# Patient Record
Sex: Female | Born: 1990 | Hispanic: Yes | Marital: Single | Smoking: Never smoker
Health system: Southern US, Community
[De-identification: ages and names within clinical notes are randomized; demographics above are authoritative.]

## PROBLEM LIST (undated history)

## (undated) DIAGNOSIS — Z789 Other specified health status: Secondary | ICD-10-CM

## (undated) DIAGNOSIS — D649 Anemia, unspecified: Secondary | ICD-10-CM

## (undated) HISTORY — PX: FACIAL RECONSTRUCTION SURGERY: SHX631

## (undated) HISTORY — DX: Anemia, unspecified: D64.9

---

## 2011-01-18 NOTE — L&D Delivery Note (Signed)
Delivery Note At 9:12 AM a viable female was delivered via Vaginal, Spontaneous Delivery (Presentation: Left Occiput Anterior).  APGAR: 9, 9; weight .   Placenta status: Intact, Spontaneous.  Cord: 3 vessels with the following complications: None.  Cord pH: not done  Anesthesia: Epidural  Episiotomy: None Lacerations: 2nd degree;Perineal Suture Repair: 2.0 vicryl Est. Blood Loss (mL): 250  Mom to postpartum.  Baby to nursery-stable.  MARSHALL,BERNARD A 12/05/2011, 9:29 AM

## 2011-07-01 LAB — OB RESULTS CONSOLE GC/CHLAMYDIA
Chlamydia: NEGATIVE
Gonorrhea: NEGATIVE
Gonorrhea: NEGATIVE

## 2011-07-01 LAB — OB RESULTS CONSOLE HIV ANTIBODY (ROUTINE TESTING): HIV: NONREACTIVE

## 2011-07-01 LAB — OB RESULTS CONSOLE ABO/RH

## 2011-07-01 LAB — OB RESULTS CONSOLE RPR: RPR: NONREACTIVE

## 2011-07-01 LAB — OB RESULTS CONSOLE RUBELLA ANTIBODY, IGM: Rubella: IMMUNE

## 2011-07-01 LAB — OB RESULTS CONSOLE ANTIBODY SCREEN: Antibody Screen: NEGATIVE

## 2011-10-22 ENCOUNTER — Inpatient Hospital Stay (HOSPITAL_COMMUNITY): Admission: AD | Admit: 2011-10-22 | Payer: Self-pay | Source: Ambulatory Visit | Admitting: Obstetrics

## 2011-11-30 ENCOUNTER — Encounter (HOSPITAL_COMMUNITY): Payer: Self-pay

## 2011-11-30 ENCOUNTER — Inpatient Hospital Stay (HOSPITAL_COMMUNITY)
Admission: AD | Admit: 2011-11-30 | Discharge: 2011-11-30 | Disposition: A | Discharge status: Home / Self Care | Payer: Self-pay | Source: Ambulatory Visit | Attending: Obstetrics | Admitting: Obstetrics

## 2011-11-30 DIAGNOSIS — O479 False labor, unspecified: Secondary | ICD-10-CM | POA: Insufficient documentation

## 2011-11-30 HISTORY — DX: Other specified health status: Z78.9

## 2011-11-30 NOTE — MAU Provider Note (Signed)
  History     CSN: [id number]  Arrival date and time: 11/30/11 1734   None     Chief Complaint  Patient presents with  . Labor Eval   HPI Called to see patient for speculum exam to rule out rupture.  OB History    Grav Para Term Preterm Abortions TAB SAB Ect Mult Living   1         0      Past Medical History  Diagnosis Date  . No pertinent past medical history     Past Surgical History  Procedure Date  . Facial reconstruction surgery     History reviewed. No pertinent family history.  History  Substance Use Topics  . Smoking status: Never Smoker   . Smokeless tobacco: Never Used  . Alcohol Use: No    Allergies: No Known Allergies  Prescriptions prior to admission  Medication Sig Dispense Refill  . calcium carbonate (TUMS - DOSED IN MG ELEMENTAL CALCIUM) 500 MG chewable tablet Chew 1 tablet by mouth daily as needed.      . Prenatal Vit-Fe Fumarate-FA (PRENATAL MULTIVITAMIN) TABS Take 1 tablet by mouth daily.        ROS\ See HPI  Physical Exam   Blood pressure 121/62, pulse 86, temperature 98.3 F (36.8 C), temperature source Oral, resp. rate 16, height 5\' 1"  (1.549 m), weight 132 lb 8 oz (60.102 kg).  Physical Exam Speculum exam:  No pooling                              Scant brown mucous                              No ferning  MAU Course  Procedures  Assessment and Plan  A:  SIUP at [redacted]w[redacted]d       No evidence of  Rupture of membranes  P:  RN to complete evaluation and discuss with MD   Wynelle Bourgeois 11/30/2011, 6:42 PM

## 2011-11-30 NOTE — MAU Note (Signed)
Pt states started leaking around 1100 looks like water per pt. Intermittent ctx's.

## 2011-12-02 ENCOUNTER — Inpatient Hospital Stay (HOSPITAL_COMMUNITY)
Admission: AD | Admit: 2011-12-02 | Discharge: 2011-12-03 | Disposition: A | Discharge status: Home / Self Care | Payer: Self-pay | Source: Ambulatory Visit | Attending: Obstetrics | Admitting: Obstetrics

## 2011-12-02 ENCOUNTER — Encounter (HOSPITAL_COMMUNITY): Payer: Self-pay | Admitting: *Deleted

## 2011-12-02 DIAGNOSIS — O479 False labor, unspecified: Secondary | ICD-10-CM | POA: Insufficient documentation

## 2011-12-02 NOTE — MAU Note (Signed)
Contractions since 2030. Some brownish/red vag. D/c

## 2011-12-02 NOTE — Progress Notes (Signed)
Occ irregularity noted to FHR. No particular pattern

## 2011-12-02 NOTE — Progress Notes (Signed)
Up to BR.

## 2011-12-03 MED ORDER — OXYCODONE-ACETAMINOPHEN 5-325 MG PO TABS
1.0000 | ORAL_TABLET | Freq: Once | ORAL | Status: AC
  Administered 2011-12-03: 1 via ORAL
  Filled 2011-12-03: qty 1

## 2011-12-03 MED ORDER — ZOLPIDEM TARTRATE 5 MG PO TABS
5.0000 mg | ORAL_TABLET | Freq: Once | ORAL | Status: AC
  Administered 2011-12-03: 5 mg via ORAL
  Filled 2011-12-03: qty 1

## 2011-12-03 NOTE — Progress Notes (Signed)
Dr Clearance Coots notified of pt's admission and status. Aware of ctx pattern, sve and reck after an hour. Pt uncomfortable with ctxs. May go home after receiving meds.

## 2011-12-03 NOTE — Progress Notes (Signed)
Written and verbal d/c instructions given and understanding voiced. 

## 2011-12-05 ENCOUNTER — Inpatient Hospital Stay (HOSPITAL_COMMUNITY)
Admission: AD | Admit: 2011-12-05 | Discharge: 2011-12-07 | DRG: 775 | Disposition: A | Discharge status: Home / Self Care | Payer: Medicaid Other | Source: Ambulatory Visit | Attending: Obstetrics | Admitting: Obstetrics

## 2011-12-05 ENCOUNTER — Encounter (HOSPITAL_COMMUNITY): Payer: Self-pay

## 2011-12-05 ENCOUNTER — Encounter (HOSPITAL_COMMUNITY): Payer: Self-pay | Admitting: Anesthesiology

## 2011-12-05 ENCOUNTER — Inpatient Hospital Stay (HOSPITAL_COMMUNITY): Payer: Medicaid Other | Admitting: Anesthesiology

## 2011-12-05 LAB — ABO/RH: ABO/RH(D): A POS

## 2011-12-05 LAB — TYPE AND SCREEN

## 2011-12-05 LAB — CBC
HCT: 33.2 % — ABNORMAL LOW (ref 36.0–46.0)
MCHC: 34 g/dL (ref 30.0–36.0)
MCV: 79.6 fL (ref 78.0–100.0)
RDW: 12.6 % (ref 11.5–15.5)

## 2011-12-05 MED ORDER — BUTORPHANOL TARTRATE 1 MG/ML IJ SOLN
1.0000 mg | INTRAMUSCULAR | Status: DC | PRN
  Administered 2011-12-05: 1 mg via INTRAVENOUS
  Filled 2011-12-05: qty 1

## 2011-12-05 MED ORDER — ONDANSETRON HCL 4 MG/2ML IJ SOLN: 4.0000 mg | Freq: Four times a day (QID) | INTRAMUSCULAR | Status: DC | PRN

## 2011-12-05 MED ORDER — EPHEDRINE 5 MG/ML INJ
10.0000 mg | INTRAVENOUS | Status: DC | PRN
  Filled 2011-12-05: qty 2

## 2011-12-05 MED ORDER — FERROUS SULFATE 325 (65 FE) MG PO TABS
325.0000 mg | ORAL_TABLET | Freq: Two times a day (BID) | ORAL | Status: DC
  Administered 2011-12-05 – 2011-12-07 (×4): 325 mg via ORAL
  Filled 2011-12-05 (×4): qty 1

## 2011-12-05 MED ORDER — ACETAMINOPHEN 325 MG PO TABS: 650.0000 mg | ORAL_TABLET | ORAL | Status: DC | PRN

## 2011-12-05 MED ORDER — OXYTOCIN BOLUS FROM INFUSION: 500.0000 mL | INTRAVENOUS | Status: DC

## 2011-12-05 MED ORDER — LACTATED RINGERS IV SOLN: 500.0000 mL | INTRAVENOUS | Status: DC | PRN

## 2011-12-05 MED ORDER — ZOLPIDEM TARTRATE 5 MG PO TABS: 5.0000 mg | ORAL_TABLET | Freq: Every evening | ORAL | Status: DC | PRN

## 2011-12-05 MED ORDER — TETANUS-DIPHTH-ACELL PERTUSSIS 5-2.5-18.5 LF-MCG/0.5 IM SUSP
0.5000 mL | Freq: Once | INTRAMUSCULAR | Status: AC
  Administered 2011-12-06: 0.5 mL via INTRAMUSCULAR
  Filled 2011-12-05: qty 0.5

## 2011-12-05 MED ORDER — LACTATED RINGERS IV SOLN: INTRAVENOUS | Status: DC

## 2011-12-05 MED ORDER — DIBUCAINE 1 % RE OINT
1.0000 "application " | TOPICAL_OINTMENT | RECTAL | Status: DC | PRN
  Filled 2011-12-05: qty 28

## 2011-12-05 MED ORDER — OXYTOCIN 40 UNITS IN LACTATED RINGERS INFUSION - SIMPLE MED
62.5000 mL/h | INTRAVENOUS | Status: DC
  Administered 2011-12-05: 999 mL/h via INTRAVENOUS
  Filled 2011-12-05: qty 1000

## 2011-12-05 MED ORDER — INFLUENZA VIRUS VACC SPLIT PF IM SUSP
0.5000 mL | INTRAMUSCULAR | Status: AC
  Administered 2011-12-06: 0.5 mL via INTRAMUSCULAR
  Filled 2011-12-05: qty 0.5

## 2011-12-05 MED ORDER — DIPHENHYDRAMINE HCL 50 MG/ML IJ SOLN: 12.5000 mg | INTRAMUSCULAR | Status: DC | PRN

## 2011-12-05 MED ORDER — PHENYLEPHRINE 40 MCG/ML (10ML) SYRINGE FOR IV PUSH (FOR BLOOD PRESSURE SUPPORT)
80.0000 ug | PREFILLED_SYRINGE | INTRAVENOUS | Status: DC | PRN
  Filled 2011-12-05: qty 2

## 2011-12-05 MED ORDER — DIPHENHYDRAMINE HCL 25 MG PO CAPS: 25.0000 mg | ORAL_CAPSULE | Freq: Four times a day (QID) | ORAL | Status: DC | PRN

## 2011-12-05 MED ORDER — IBUPROFEN 600 MG PO TABS
600.0000 mg | ORAL_TABLET | Freq: Four times a day (QID) | ORAL | Status: DC
  Administered 2011-12-05 – 2011-12-07 (×8): 600 mg via ORAL
  Filled 2011-12-05 (×2): qty 1

## 2011-12-05 MED ORDER — CITRIC ACID-SODIUM CITRATE 334-500 MG/5ML PO SOLN: 30.0000 mL | ORAL | Status: DC | PRN

## 2011-12-05 MED ORDER — LIDOCAINE HCL (PF) 1 % IJ SOLN
30.0000 mL | INTRAMUSCULAR | Status: DC | PRN
  Filled 2011-12-05 (×2): qty 30

## 2011-12-05 MED ORDER — PROMETHAZINE HCL 25 MG/ML IJ SOLN
12.5000 mg | Freq: Four times a day (QID) | INTRAMUSCULAR | Status: DC | PRN
  Administered 2011-12-05: 12.5 mg via INTRAVENOUS
  Filled 2011-12-05: qty 1

## 2011-12-05 MED ORDER — LIDOCAINE HCL (PF) 1 % IJ SOLN
INTRAMUSCULAR | Status: DC | PRN
  Administered 2011-12-05 (×4): 4 mL

## 2011-12-05 MED ORDER — FENTANYL 2.5 MCG/ML BUPIVACAINE 1/10 % EPIDURAL INFUSION (WH - ANES)
14.0000 mL/h | INTRAMUSCULAR | Status: DC
  Administered 2011-12-05: 14 mL/h via EPIDURAL
  Filled 2011-12-05: qty 125

## 2011-12-05 MED ORDER — BENZOCAINE-MENTHOL 20-0.5 % EX AERO
1.0000 "application " | INHALATION_SPRAY | CUTANEOUS | Status: DC | PRN
  Filled 2011-12-05: qty 56

## 2011-12-05 MED ORDER — SIMETHICONE 80 MG PO CHEW: 80.0000 mg | CHEWABLE_TABLET | ORAL | Status: DC | PRN

## 2011-12-05 MED ORDER — LACTATED RINGERS IV SOLN: 500.0000 mL | Freq: Once | INTRAVENOUS | Status: DC

## 2011-12-05 MED ORDER — FLEET ENEMA 7-19 GM/118ML RE ENEM: 1.0000 | ENEMA | RECTAL | Status: DC | PRN

## 2011-12-05 MED ORDER — BUTORPHANOL TARTRATE 2 MG/ML IJ SOLN: 2.0000 mg | Freq: Once | INTRAMUSCULAR | Status: DC

## 2011-12-05 MED ORDER — OXYCODONE-ACETAMINOPHEN 5-325 MG PO TABS
1.0000 | ORAL_TABLET | ORAL | Status: DC | PRN
  Filled 2011-12-05: qty 1

## 2011-12-05 MED ORDER — LANOLIN HYDROUS EX OINT: TOPICAL_OINTMENT | CUTANEOUS | Status: DC | PRN

## 2011-12-05 MED ORDER — ONDANSETRON HCL 4 MG/2ML IJ SOLN: 4.0000 mg | INTRAMUSCULAR | Status: DC | PRN

## 2011-12-05 MED ORDER — PHENYLEPHRINE 40 MCG/ML (10ML) SYRINGE FOR IV PUSH (FOR BLOOD PRESSURE SUPPORT)
80.0000 ug | PREFILLED_SYRINGE | INTRAVENOUS | Status: DC | PRN
  Filled 2011-12-05: qty 2
  Filled 2011-12-05: qty 5

## 2011-12-05 MED ORDER — SENNOSIDES-DOCUSATE SODIUM 8.6-50 MG PO TABS
2.0000 | ORAL_TABLET | Freq: Every day | ORAL | Status: DC
  Administered 2011-12-05 – 2011-12-06 (×2): 2 via ORAL

## 2011-12-05 MED ORDER — WITCH HAZEL-GLYCERIN EX PADS: 1.0000 "application " | MEDICATED_PAD | CUTANEOUS | Status: DC | PRN

## 2011-12-05 MED ORDER — OXYCODONE-ACETAMINOPHEN 5-325 MG PO TABS
1.0000 | ORAL_TABLET | ORAL | Status: DC | PRN
  Administered 2011-12-05: 1 via ORAL

## 2011-12-05 MED ORDER — PRENATAL MULTIVITAMIN CH
1.0000 | ORAL_TABLET | Freq: Every day | ORAL | Status: DC
  Administered 2011-12-05 – 2011-12-07 (×3): 1 via ORAL
  Filled 2011-12-05 (×3): qty 1

## 2011-12-05 MED ORDER — EPHEDRINE 5 MG/ML INJ
10.0000 mg | INTRAVENOUS | Status: DC | PRN
  Filled 2011-12-05: qty 2
  Filled 2011-12-05: qty 4

## 2011-12-05 MED ORDER — IBUPROFEN 600 MG PO TABS
600.0000 mg | ORAL_TABLET | Freq: Four times a day (QID) | ORAL | Status: DC | PRN
  Filled 2011-12-05 (×6): qty 1

## 2011-12-05 MED ORDER — ONDANSETRON HCL 4 MG PO TABS: 4.0000 mg | ORAL_TABLET | ORAL | Status: DC | PRN

## 2011-12-05 NOTE — Progress Notes (Signed)
Patient ID: Michelle Heath, female   DOB: [birth year], 21 y.o.   MRN: [id number] Patient now on fully dilated and 0 station largely without

## 2011-12-05 NOTE — H&P (Signed)
Michelle Heath is a 21 y.o. female presenting for UC's. Maternal Medical History:  Reason for admission: Reason for admission: contractions.  21 yo G1 EDC 12-02-11.  Presents with UC's.  Contractions: Onset was 3-5 hours ago.   Frequency: regular.   Perceived severity is moderate.    Fetal activity: Perceived fetal activity is normal.   Last perceived fetal movement was within the past hour.    Prenatal complications: no prenatal complications Prenatal Complications - Diabetes: none.    OB History    Grav Para Term Preterm Abortions TAB SAB Ect Mult Living   1         0     Past Medical History  Diagnosis Date  . No pertinent past medical history    Past Surgical History  Procedure Date  . Facial reconstruction surgery    Family History: family history is negative for Other. Social History:  reports that she has never smoked. She has never used smokeless tobacco. She reports that she does not drink alcohol or use illicit drugs.   Prenatal Transfer Tool  Maternal Diabetes: No Genetic Screening: Normal Maternal Ultrasounds/Referrals: Normal Fetal Ultrasounds or other Referrals:  None Maternal Substance Abuse:  No Significant Maternal Medications:  Meds include: Other:  Significant Maternal Lab Results:  Lab values include: Other:  Other Comments:  None  Review of Systems  All other systems reviewed and are negative.    Dilation: Lip/rim Effacement (%): 100 Station: 0 Exam by:: L. Cresenzo, RN Blood pressure 134/83, pulse 85. Maternal Exam:  Uterine Assessment: Contraction strength is firm.  Contraction frequency is regular.   Abdomen: Patient reports no abdominal tenderness. Fetal presentation: vertex  Introitus: Normal vulva. Normal vagina.  Cervix: Cervix evaluated by digital exam.     Physical Exam  Nursing note and vitals reviewed. Constitutional: She is oriented to person, place, and time. She appears well-developed and well-nourished.    HENT:  Head: Normocephalic and atraumatic.  Eyes: Conjunctivae normal are normal. Pupils are equal, round, and reactive to light.  Neck: Normal range of motion. Neck supple.  Cardiovascular: Normal rate and regular rhythm.   Respiratory: Effort normal and breath sounds normal.  GI: Soft.  Genitourinary: Vagina normal and uterus normal.  Musculoskeletal: Normal range of motion.  Neurological: She is alert and oriented to person, place, and time.  Skin: Skin is warm and dry.  Psychiatric: She has a normal mood and affect. Her behavior is normal. Judgment and thought content normal.    Prenatal labs: ABO, Rh: A/Positive/-- (06/14 0000) Antibody: Negative (06/14 0000) Rubella: Immune (06/14 0000) RPR: Nonreactive, Nonreactive (06/14 0000)  HBsAg: Negative (06/14 0000)  HIV: Non-reactive, Non-reactive (06/14 0000)  GBS: Negative (11/18 0000)   Assessment/Plan: 40 weeks.  Active labor.  Expectant.   Brazen Domangue A 12/05/2011, 6:15 AM

## 2011-12-05 NOTE — Anesthesia Preprocedure Evaluation (Signed)

## 2011-12-05 NOTE — Progress Notes (Signed)
Laya Anel Alva-Quezada is a 21 y.o. G1P0 at [redacted]w[redacted]d by LMP admitted for active labor  Subjective:   Objective: BP 134/83  Pulse 85      FHT:  FHR: 150 bpm, variability: moderate,  accelerations:  Present,  decelerations:  Absent UC:   regular, every 3 minutes SVE:   Dilation: Lip/rim Effacement (%): 100 Station: 0 Exam by:: Ace Gins, RN  Labs: Lab Results  Component Value Date   WBC 8.0 12/05/2011   HGB 11.3* 12/05/2011   HCT 33.2* 12/05/2011   MCV 79.6 12/05/2011   PLT 158 12/05/2011    Assessment / Plan: Spontaneous labor, progressing normally  Labor: Progressing normally Preeclampsia:  n/a Fetal Wellbeing:  Category I Pain Control:  Epidural I/D:  n/a Anticipated MOD:  NSVD  Myka Lukins A 12/05/2011, 6:28 AM

## 2011-12-05 NOTE — Anesthesia Procedure Notes (Signed)
Epidural Patient location during procedure: OB Start time: 12/05/2011 6:43 AM  Staffing Performed by: anesthesiologist   Preanesthetic Checklist Completed: patient identified, site marked, surgical consent, pre-op evaluation, timeout performed, IV checked, risks and benefits discussed and monitors and equipment checked  Epidural Patient position: sitting Prep: site prepped and draped and DuraPrep Patient monitoring: continuous pulse ox and blood pressure Approach: midline Injection technique: LOR air  Needle:  Needle type: Tuohy  Needle gauge: 17 G Needle length: 9 cm and 9 Needle insertion depth: 4.5 cm Catheter type: closed end flexible Catheter size: 19 Gauge Catheter at skin depth: 9.5 cm Test dose: negative  Assessment Events: blood not aspirated, injection not painful, no injection resistance, negative IV test and no paresthesia  Additional Notes Discussed risk of headache, infection, bleeding, nerve injury and failed or incomplete block.  Patient voices understanding and wishes to proceed. Reason for block:procedure for pain

## 2011-12-06 LAB — CBC
HCT: 26 % — ABNORMAL LOW (ref 36.0–46.0)
Hemoglobin: 8.6 g/dL — ABNORMAL LOW (ref 12.0–15.0)
RBC: 3.24 MIL/uL — ABNORMAL LOW (ref 3.87–5.11)
WBC: 9.8 10*3/uL (ref 4.0–10.5)

## 2011-12-06 NOTE — Anesthesia Postprocedure Evaluation (Signed)
  Anesthesia Post-op Note  Patient: Michelle Heath  Procedure(s) Performed: * No procedures listed *  Patient Location: Mother/Baby  Anesthesia Type:Epidural  Level of Consciousness: awake  Airway and Oxygen Therapy: Patient Spontanous Breathing  Post-op Pain: none  Post-op Assessment: Patient's Cardiovascular Status Stable, Respiratory Function Stable, Patent Airway, No signs of Nausea or vomiting, Adequate PO intake, Pain level controlled, No headache, No backache, No residual numbness and No residual motor weakness  Post-op Vital Signs: Reviewed and stable  Complications: No apparent anesthesia complications

## 2011-12-06 NOTE — Progress Notes (Signed)
UR chart review completed.  

## 2011-12-06 NOTE — Progress Notes (Signed)
Patient ID: Michelle Heath, female   DOB: 1990-08-08, 21 y.o.   MRN: [id number] Postpartum day one Vital signs normal Fundus firm Lochia moderate Doing well

## 2011-12-07 MED ORDER — PRENATAL MULTIVITAMIN CH: 1.0000 | ORAL_TABLET | Freq: Every day | ORAL | Status: DC

## 2011-12-07 MED ORDER — BENZOCAINE-MENTHOL 20-0.5 % EX AERO: 1.0000 "application " | INHALATION_SPRAY | CUTANEOUS | Status: DC | PRN

## 2011-12-07 MED ORDER — ZOLPIDEM TARTRATE 5 MG PO TABS: 5.0000 mg | ORAL_TABLET | Freq: Every evening | ORAL | Status: DC | PRN

## 2011-12-07 MED ORDER — ONDANSETRON HCL 4 MG/2ML IJ SOLN: 4.0000 mg | INTRAMUSCULAR | Status: DC | PRN

## 2011-12-07 MED ORDER — SIMETHICONE 80 MG PO CHEW: 80.0000 mg | CHEWABLE_TABLET | ORAL | Status: DC | PRN

## 2011-12-07 MED ORDER — WITCH HAZEL-GLYCERIN EX PADS: 1.0000 "application " | MEDICATED_PAD | CUTANEOUS | Status: DC | PRN

## 2011-12-07 MED ORDER — OXYCODONE-ACETAMINOPHEN 5-325 MG PO TABS: 1.0000 | ORAL_TABLET | ORAL | Status: DC | PRN

## 2011-12-07 MED ORDER — IBUPROFEN 600 MG PO TABS: 600.0000 mg | ORAL_TABLET | Freq: Four times a day (QID) | ORAL | Status: DC

## 2011-12-07 MED ORDER — TETANUS-DIPHTH-ACELL PERTUSSIS 5-2.5-18.5 LF-MCG/0.5 IM SUSP: 0.5000 mL | Freq: Once | INTRAMUSCULAR | Status: DC

## 2011-12-07 MED ORDER — SENNOSIDES-DOCUSATE SODIUM 8.6-50 MG PO TABS: 2.0000 | ORAL_TABLET | Freq: Every day | ORAL | Status: DC

## 2011-12-07 MED ORDER — FERROUS SULFATE 325 (65 FE) MG PO TABS: 325.0000 mg | ORAL_TABLET | Freq: Two times a day (BID) | ORAL | Status: DC

## 2011-12-07 MED ORDER — LANOLIN HYDROUS EX OINT: TOPICAL_OINTMENT | CUTANEOUS | Status: DC | PRN

## 2011-12-07 MED ORDER — DIBUCAINE 1 % RE OINT: 1.0000 "application " | TOPICAL_OINTMENT | RECTAL | Status: DC | PRN

## 2011-12-07 MED ORDER — ONDANSETRON HCL 4 MG PO TABS: 4.0000 mg | ORAL_TABLET | ORAL | Status: DC | PRN

## 2011-12-07 MED ORDER — DIPHENHYDRAMINE HCL 25 MG PO CAPS: 25.0000 mg | ORAL_CAPSULE | Freq: Four times a day (QID) | ORAL | Status: DC | PRN

## 2011-12-07 NOTE — Discharge Summary (Signed)
Obstetric Discharge Summary Reason for Admission: onset of labor Prenatal Procedures: none Intrapartum Procedures: spontaneous vaginal delivery Postpartum Procedures: none Complications-Operative and Postpartum: none Hemoglobin  Date Value Range Status  12/06/2011 8.6* 12.0 - 15.0 g/dL Final     DELTA CHECK NOTED     REPEATED TO VERIFY     HCT  Date Value Range Status  12/06/2011 26.0* 36.0 - 46.0 % Final    Physical Exam:  General: alert Lochia: appropriate Uterine Fundus: firm Incision: healing well DVT Evaluation: No evidence of DVT seen on physical exam.  Discharge Diagnoses: Term Pregnancy-delivered  Discharge Information: Date: 12/07/2011 Activity: pelvic rest Diet: routine Medications: Percocet Condition: stable Instructions: refer to practice specific booklet Discharge to: home Follow-up Information    Call in 6 weeks to follow up.   Contact information:   [address] [place]         Newborn Data: Live born female  Birth Weight: 6 lb 14.1 oz (3121 g) APGAR: 9, 9  Home with mother.  Michelle Heath A 12/07/2011, 7:46 AM

## 2011-12-07 NOTE — Discharge Summary (Signed)
Obstetric Discharge Summary Reason for Admission: onset of labor Prenatal Procedures: none Intrapartum Procedures: spontaneous vaginal delivery Postpartum Procedures: none Complications-Operative and Postpartum: none Hemoglobin  Date Value Range Status  12/06/2011 8.6* 12.0 - 15.0 g/dL Final     DELTA CHECK NOTED     REPEATED TO VERIFY     HCT  Date Value Range Status  12/06/2011 26.0* 36.0 - 46.0 % Final    Physical Exam:  General: alert Lochia: appropriate Uterine Fundus: firm Incision: healing well DVT Evaluation: No evidence of DVT seen on physical exam.  Discharge Diagnoses: Term Pregnancy-delivered  Discharge Information: Date: 12/07/2011 Activity: pelvic rest Diet: routine Medications: Percocet Condition: stable Instructions: refer to practice specific booklet Discharge to: home Follow-up Information    Call in 6 weeks to follow up.   Contact information:   [address] [place]         Newborn Data: Live born female  Birth Weight: 6 lb 14.1 oz (3121 g) APGAR: 9, 9  Home with mother.  Chanson Teems A 12/07/2011, 7:41 AM

## 2011-12-12 ENCOUNTER — Ambulatory Visit (HOSPITAL_COMMUNITY): Admit: 2011-12-12 | Payer: Self-pay

## 2013-05-19 ENCOUNTER — Emergency Department (HOSPITAL_COMMUNITY): Payer: Self-pay

## 2013-05-19 ENCOUNTER — Encounter (HOSPITAL_COMMUNITY): Payer: Self-pay | Admitting: Emergency Medicine

## 2013-05-19 ENCOUNTER — Emergency Department (HOSPITAL_COMMUNITY)
Admission: EM | Admit: 2013-05-19 | Discharge: 2013-05-19 | Disposition: A | Discharge status: Home / Self Care | Payer: Self-pay | Attending: Emergency Medicine | Admitting: Emergency Medicine

## 2013-05-19 DIAGNOSIS — Y9344 Activity, trampolining: Secondary | ICD-10-CM | POA: Insufficient documentation

## 2013-05-19 DIAGNOSIS — X500XXA Overexertion from strenuous movement or load, initial encounter: Secondary | ICD-10-CM | POA: Insufficient documentation

## 2013-05-19 DIAGNOSIS — S9306XA Dislocation of unspecified ankle joint, initial encounter: Secondary | ICD-10-CM | POA: Insufficient documentation

## 2013-05-19 DIAGNOSIS — Y929 Unspecified place or not applicable: Secondary | ICD-10-CM | POA: Insufficient documentation

## 2013-05-19 MED ORDER — LIDOCAINE HCL 1 % IJ SOLN
INTRAMUSCULAR | Status: AC
  Administered 2013-05-19: 20 mL
  Filled 2013-05-19: qty 20

## 2013-05-19 MED ORDER — PROPOFOL 10 MG/ML IV BOLUS
INTRAVENOUS | Status: AC | PRN
  Administered 2013-05-19: 60 mg via INTRAVENOUS
  Administered 2013-05-19 (×3): 30 mg via INTRAVENOUS

## 2013-05-19 MED ORDER — PROPOFOL 10 MG/ML IV BOLUS
INTRAVENOUS | Status: DC | PRN
  Administered 2013-05-19: 60 mg via INTRAVENOUS

## 2013-05-19 MED ORDER — HYDROMORPHONE HCL PF 1 MG/ML IJ SOLN
INTRAMUSCULAR | Status: AC | PRN
  Administered 2013-05-19: 1 mg via INTRAVENOUS

## 2013-05-19 MED ORDER — FENTANYL CITRATE 0.05 MG/ML IJ SOLN
50.0000 ug | Freq: Once | INTRAMUSCULAR | Status: AC
  Administered 2013-05-19: 50 ug via INTRAVENOUS
  Filled 2013-05-19: qty 2

## 2013-05-19 MED ORDER — ONDANSETRON HCL 4 MG/2ML IJ SOLN
4.0000 mg | Freq: Once | INTRAMUSCULAR | Status: AC
  Administered 2013-05-19: 4 mg via INTRAVENOUS
  Filled 2013-05-19: qty 2

## 2013-05-19 MED ORDER — OXYCODONE-ACETAMINOPHEN 5-325 MG PO TABS: 1.0000 | ORAL_TABLET | Freq: Four times a day (QID) | ORAL | Status: DC | PRN

## 2013-05-19 MED ORDER — PROPOFOL 10 MG/ML IV BOLUS
30.0000 mg | INTRAVENOUS | Status: DC | PRN
  Filled 2013-05-19 (×2): qty 1

## 2013-05-19 MED ORDER — HYDROMORPHONE HCL PF 1 MG/ML IJ SOLN
1.0000 mg | Freq: Once | INTRAMUSCULAR | Status: AC
  Filled 2013-05-19: qty 1

## 2013-05-19 NOTE — ED Notes (Signed)
Pt was jumping on the trampoline and injured her rt ankle.  Obvious deformity noted to rt ankle.  Pulses, sensation, movement present distal to injury.

## 2013-05-19 NOTE — Sedation Documentation (Signed)
Dr. Lynelle DoctorKnapp unable to reduce the ankle.

## 2013-05-19 NOTE — ED Provider Notes (Signed)
CSN: [id number]     Arrival date & time 05/19/13  1854 History   First MD Initiated Contact with Patient 05/19/13 1906     Chief Complaint  Patient presents with  . Ankle Injury   Patient is a 23 y.o. female presenting with lower extremity injury. The history is provided by the patient.  Ankle Injury This is a new problem. Episode onset: shortly before arrival. The problem occurs constantly. The problem has not changed since onset.Associated symptoms comments: No there injuries or pain . Exacerbated by: palpation, movement. Nothing relieves the symptoms.  Pt saw that her ankle was out of place after injuring herself while jumping on a trampoline.  Family attempted to reduce the ankle at home.    Past Medical History  Diagnosis Date  . No pertinent past medical history    Past Surgical History  Procedure Laterality Date  . Facial reconstruction surgery     Family History  Problem Relation Age of Onset  . Other Neg Hx    History  Substance Use Topics  . Smoking status: Never Smoker   . Smokeless tobacco: Never Used  . Alcohol Use: No   OB History   Grav Para Term Preterm Abortions TAB SAB Ect Mult Living   1 1 1  0 0 0 0 0 0 1     Review of Systems  All other systems reviewed and are negative.     Allergies  Review of patient's allergies indicates no known allergies.  Home Medications   Prior to Admission medications   Not on File   BP 121/79  Pulse 97  Temp(Src) 97.9 F (36.6 C) (Oral)  Resp 17  Ht 5' (1.524 m)  Wt 129 lb (58.514 kg)  BMI 25.19 kg/m2  SpO2 100%  LMP 05/19/2013 Physical Exam  Nursing note and vitals reviewed. Constitutional: She appears well-developed and well-nourished. No distress.  HENT:  Head: Normocephalic and atraumatic.  Right Ear: External ear normal.  Left Ear: External ear normal.  Eyes: Conjunctivae are normal. Right eye exhibits no discharge. Left eye exhibits no discharge. No scleral icterus.  Neck: Neck supple. No tracheal  deviation present.  Cardiovascular: Normal rate, regular rhythm and normal heart sounds.   Pulmonary/Chest: Effort normal and breath sounds normal. No stridor. No respiratory distress. She has no wheezes.  Musculoskeletal: She exhibits no edema.       Right ankle: She exhibits swelling and deformity. She exhibits no laceration and normal pulse. Tenderness. Lateral malleolus tenderness found. Achilles tendon normal.  Neurological: She is alert. Cranial nerve deficit: no gross deficits.  Skin: Skin is warm and dry. No rash noted.  Psychiatric: She has a normal mood and affect.    ED Course  Procedural sedation Date/Time: 05/19/2013 8:44 PM Performed by: Linwood DibblesKNAPP, Carsten Carstarphen R Authorized by: Linwood DibblesKNAPP, Betsi Crespi R Consent: Verbal consent obtained. written consent obtained. Risks and benefits: risks, benefits and alternatives were discussed Patient sedated: yes Sedatives: propofol Sedation end date/time: 05/19/2013 8:44 PM Vitals: Vital signs were monitored during sedation. Patient tolerance: Patient tolerated the procedure well with no immediate complications. Comments: See nursing notes for start and stop times.  Reduction of dislocation Date/Time: 05/19/2013 8:45 PM Performed by: Linwood DibblesKNAPP, Navah Grondin R Authorized by: Linwood DibblesKNAPP, Markesha Hannig R Consent: Verbal consent obtained. written consent obtained. Risks and benefits: risks, benefits and alternatives were discussed Patient sedated: yes Patient tolerance: Patient tolerated the procedure well with no immediate complications. Comments: Unsuccessful attempt at ankle reduction.  Procedural sedation Date/Time: 05/19/2013 9:57 PM Performed  by: Dajohn Ellender R Authorized by: Linwood DibblesKNAPP, Athira Janowicz R Consent: Verbal consent obtained. written consent obtained. Risks and benefits: risks, benefits and alternatives were discussed Patient sedated: yes Sedatives: propofol Comments: Additional procedural sedation performed while Dr Magnus IvanBlackman performed the reduction.  Successful reduction.  Pt tolerated  well.  No hypoxia.   See nursing notes for total times.   (including critical care time) Labs Review Labs Reviewed - No data to display  Imaging Review Dg Ankle Complete Right  05/19/2013   CLINICAL DATA:  Injured ankle.  EXAM: RIGHT ANKLE - COMPLETE 3+ VIEW  COMPARISON:  None.  FINDINGS: There is a complex anterior dislocation of the talus which is position vertically anterior to the tibia.  There is a transverse fracture of the fibula at the level of the ankle mortise and I suspect there are small avulsion fractures and from the medial malleolus (due to disruption of the deltoid ligament).  IMPRESSION: Anterior dislocation of the talus.  Transverse nondisplaced fracture of the distal fibula.  Small avulsion fractures likely from the medial malleolus.   Electronically Signed   By: Loralie ChampagneMark  Gallerani M.D.   On: 05/19/2013 19:26   Dg Ankle Right Port  05/19/2013   CLINICAL DATA:  Attempted interval reduction of right ankle fracture dislocation  EXAM: PORTABLE RIGHT ANKLE - 2 VIEW  COMPARISON:  None.  FINDINGS: There is persistent anterior dislocation of the talus which is rotated with a vertical orientation. There are tiny ossific fragment adjacent to the medial malleolus likely representing sequela of avulsive injury.  There is a persistent minimally displaced distal fibular fracture of the diametaphysis. There is surrounding soft tissue swelling.  IMPRESSION: 1. Persistent anterior dislocation of the talus which is rotated with a vertical orientation.   Electronically Signed   By: Elige KoHetal  Patel   On: 05/19/2013 21:09    Medications  propofol (DIPRIVAN) 10 mg/mL bolus/IV push 30 mg (not administered)  propofol (DIPRIVAN) 10 mg/mL bolus/IV push ( Intravenous Stopped 05/19/13 2156)  fentaNYL (SUBLIMAZE) injection 50 mcg (50 mcg Intravenous Given by Other 05/19/13 1927)  HYDROmorphone (DILAUDID) injection 1 mg (0 mg Intravenous Duplicate 05/19/13 2037)  ondansetron (ZOFRAN) injection 4 mg (4 mg Intravenous Given  05/19/13 2037)  propofol (DIPRIVAN) 10 mg/mL bolus/IV push ( Intravenous Stopped 05/19/13 2039)  HYDROmorphone (DILAUDID) injection (1 mg Intravenous Given 05/19/13 2028)  lidocaine (XYLOCAINE) 1 % (with pres) injection (20 mLs  Given by Other 05/19/13 2116)     MDM   Final diagnoses:  Ankle dislocation    Pt required several attempts but eventually successfully reduced.  Dr Magnus IvanBlackman came to the ED and performed the reduction after my unsuccessful attempt.  Pt tolerated well.  Will dc home.  Follow up in his office.  Ice , elevate   Celene KrasJon R Elsi Stelzer, MD 05/19/13 2200

## 2013-05-19 NOTE — Consult Note (Signed)
Reason for Consult:  Right ankle fracture-dislocation Referring Physician:  Lynelle DoctorKnapp, MD  Michelle Heath is an 23 y.o. female.  HPI:   23 yo female who injured her right ankle while jumping on a trampoline.  Was brought to the ED with an obvious right ankle deformity and x-rays confirmed a complex ankle fracture-dislocation.  Orthopedics is consulted to address the injury.  Past Medical History  Diagnosis Date  . No pertinent past medical history     Past Surgical History  Procedure Laterality Date  . Facial reconstruction surgery      Family History  Problem Relation Age of Onset  . Other Neg Hx     Social History:  reports that she has never smoked. She has never used smokeless tobacco. She reports that she does not drink alcohol or use illicit drugs.  Allergies: No Known Allergies  Medications: I have reviewed the patient's current medications.  No results found for this or any previous visit (from the past 48 hour(s)).  Dg Ankle Complete Right  05/19/2013   CLINICAL DATA:  Injured ankle.  EXAM: RIGHT ANKLE - COMPLETE 3+ VIEW  COMPARISON:  None.  FINDINGS: There is a complex anterior dislocation of the talus which is position vertically anterior to the tibia.  There is a transverse fracture of the fibula at the level of the ankle mortise and I suspect there are small avulsion fractures and from the medial malleolus (due to disruption of the deltoid ligament).  IMPRESSION: Anterior dislocation of the talus.  Transverse nondisplaced fracture of the distal fibula.  Small avulsion fractures likely from the medial malleolus.   Electronically Signed   By: Loralie ChampagneMark  Gallerani M.D.   On: 05/19/2013 19:26   Dg Ankle Right Port  05/19/2013   CLINICAL DATA:  Attempted interval reduction of right ankle fracture dislocation  EXAM: PORTABLE RIGHT ANKLE - 2 VIEW  COMPARISON:  None.  FINDINGS: There is persistent anterior dislocation of the talus which is rotated with a vertical orientation.  There are tiny ossific fragment adjacent to the medial malleolus likely representing sequela of avulsive injury.  There is a persistent minimally displaced distal fibular fracture of the diametaphysis. There is surrounding soft tissue swelling.  IMPRESSION: 1. Persistent anterior dislocation of the talus which is rotated with a vertical orientation.   Electronically Signed   By: Elige KoHetal  Patel   On: 05/19/2013 21:09    Review of Systems  All other systems reviewed and are negative.  Blood pressure 121/79, pulse 97, temperature 97.9 F (36.6 C), temperature source Oral, resp. rate 17, height 5' (1.524 m), weight 58.514 kg (129 lb), last menstrual period 05/19/2013, SpO2 100.00%, unknown if currently breastfeeding. Physical Exam  Musculoskeletal:       Right ankle: She exhibits decreased range of motion, swelling, ecchymosis and deformity. Tenderness. Lateral malleolus tenderness found.       Feet:  There is an obvious dislocation of the tibia-talar joint with the talus out anteriolateral She has a well-perfused right foot with palpable pulses.  Her sensation is normal.   Assessment/Plan: Right complex ankle fracture-dislocation with a fracture of the lateral malleolus and a dislocation of the talus 1)  After placing 1% lidocaine as an intra-articular injection, Dr. Lynelle DoctorKnapp provided conscious sedation and a closed reduction with manipulation was performed.  We we able to reduce the deformity and post-reduction films showed normal ankle tibiotalar joint alignment.  A well-padded plaster splint was applied.  She was given crutches and instructions for elevation  and ice as well as non-weight bearing on her right leg.  She will also take a 325 mg aspirin daily.  She has information to call my office for a follow-up appointment later this week.  Michelle Heath 05/19/2013, 9:55 PM

## 2013-05-19 NOTE — Discharge Instructions (Signed)
Ankle Dislocation Ankle dislocation is the displacement of the bones that form your ankle joint. The ankle joint is designed for a balance of stability and flexibility. The bones of the ankle are held in place by very strong, fibrous tissues (ligaments) that connect the bones to each other. CAUSES Because the ankle is a very strong and stable joint, ankle dislocation is only caused by a very forceful injury. Typically, injuries that contribute to ankle dislocation include broken bones (fractures) on the inside and outside of the ankle (malleoli).  RELATED COMPLICATIONS Ankle dislocation can lead to more serious complications. Examples of complications associated with ankle dislocation include:  Injury to the strong fibrous tissues that connect muscles to bones (tendons).  Injury to the flexible tissue that cushions the bones in the joint (cartilage). This can lead to the development of arthritis, loss of joint motion, and pain.  Injury to the nerves and blood vessels that cross the ankle. Blood vessel damage may result in bone death of the top bone of the foot (talus).  Skin over the dislocated area being torn (lacerated) or damaged by pressure from the dislocated bones.  Swelling of compartments in the foot (rare). This may damage blood supply to the muscles (compartment syndrome). RISK FACTORS Although dislocation of the ankle can occur in anyone, some people are at greater risk than others. People at increased risk of ankle dislocation include:  Young males. This may be related to their overall increased risk of injury.  Postmenopausal women. This may be related to their increased risk of bone fracture because of the weakening of the bones that occurs in women in this age group (osteoporosis).  People born with greater looseness (elasticity) in their ligaments. SYMPTOMS Symptoms of ankle dislocation include:  Severe pain.  Swelling.  Deformity around the ankle.  Whitening or  laceration of the skin. DIAGNOSIS  A physical exam and an X-ray exam are usually done to help your caregiver diagnose ankle dislocation. TREATMENT Treatment may include:  Manipulation of the ankle by your caregiver to put your ankle back in place (reduction).  Repair of any associated skin lacerations.  Plates and screws used to stabilize the fractures and hold the joint in position after reduction.  Pins drilled into your bones that are connected to bars outside of your skin (external fixator) used to hold your ankle in a fixed position until the swelling in your ankle goes down enough for surgery to be done.  Placement of a cast or splint to allow torn ligaments to heal.  Physical therapy to regain ankle motion and leg strength. HOME CARE INSTRUCTIONS The following measures can help to reduce pain and hasten the healing process:  Rest your injured joint. Do not move it. Avoid activities similar to the one that caused your injury.  Apply ice to your injured joint for 1 to 2 days after your reduction or as directed by your caregiver. Applying ice helps to reduce inflammation and pain.  Put ice in a plastic bag.  Place a towel between your skin and the bag.  Leave the ice on for 15 to 20 minutes at a time, every couple of hours while you are awake.  Elevate your ankle above your heart to minimize swelling.  Move your toes as instructed by your caregiver to prevent stiffness.  Take over-the-counter or prescription medicines for pain as directed by your caregiver. SEEK IMMEDIATE MEDICAL CARE IF:  Your cast, splint, screws, plates, or external fixator becomes loose or damaged.  You have an external fixator and you notice fluids draining around the pins.  Your pain becomes worse rather than better.  You lose feeling in your toe or cannot bend the tip of your toe. MAKE SURE YOU:  Understand these instructions.  Will watch your condition.  Will get help right away if you  are not doing well or get worse. Document Released: 01/03/2005 Document Revised: 03/28/2011 Document Reviewed: 06/03/2010 [facility] Patient Information 2014 [place].

## 2013-05-19 NOTE — ED Notes (Signed)
Dr. Blackman at bedside. 

## 2013-10-24 ENCOUNTER — Emergency Department (HOSPITAL_COMMUNITY)
Admission: EM | Admit: 2013-10-24 | Discharge: 2013-10-24 | Discharge status: Against Medical Advice | Payer: Medicaid Other

## 2013-11-18 ENCOUNTER — Encounter (HOSPITAL_COMMUNITY): Payer: Self-pay | Admitting: Emergency Medicine

## 2014-06-04 ENCOUNTER — Telehealth (HOSPITAL_COMMUNITY): Payer: Self-pay | Admitting: *Deleted

## 2014-06-04 NOTE — Telephone Encounter (Signed)
Telephoned patient at home # and left message to return call to [facility] 

## 2014-06-25 ENCOUNTER — Encounter (HOSPITAL_COMMUNITY): Payer: Self-pay | Admitting: *Deleted

## 2014-06-26 ENCOUNTER — Ambulatory Visit (HOSPITAL_COMMUNITY)
Admission: RE | Admit: 2014-06-26 | Discharge: 2014-06-26 | Disposition: A | Discharge status: Home / Self Care | Payer: Medicaid Other | Source: Ambulatory Visit | Attending: Obstetrics and Gynecology | Admitting: Obstetrics and Gynecology

## 2014-06-26 ENCOUNTER — Encounter (HOSPITAL_COMMUNITY): Payer: Self-pay

## 2014-06-26 VITALS — BP 98/60 | Temp 98.4°F | Ht 61.0 in | Wt 117.0 lb

## 2014-06-26 DIAGNOSIS — R87612 Low grade squamous intraepithelial lesion on cytologic smear of cervix (LGSIL): Secondary | ICD-10-CM

## 2014-06-26 DIAGNOSIS — Z1239 Encounter for other screening for malignant neoplasm of breast: Secondary | ICD-10-CM

## 2014-06-26 NOTE — Progress Notes (Signed)
CLINIC:  [facility] [employer]) Clinic  REASON FOR VISIT: Well-woman exam  HISTORY OF PRESENT ILLNESS:  [name] is a 24 y.o. female who [facility] today for clinical breast exam. No family history of breast cancer. She has no complaints today.  Her last pap smear was on 05/28/14 and revealed LGSIL and HPV (+)   She has no previous history of abnormal pap smears.  She is scheduled for colposcopy on 06/30/14.  REVIEW OF SYSTEMS:  Denies any breast pain, nodularity, nipple inversion, or nipple discharge bilaterally.   ALLERGIES: No Known Allergies  CURRENT MEDICATIONS:  Current Outpatient Prescriptions on File Prior to Encounter  Medication Sig Dispense Refill  . oxyCODONE-acetaminophen (PERCOCET/ROXICET) 5-325 MG per tablet Take 1-2 tablets by mouth every 6 (six) hours as needed. 30 tablet 0   No current facility-administered medications on file prior to encounter.     SOCIAL HISTORY:  Michelle Heath has a 85 year old daughter named Michelle Heath.       PHYSICAL EXAM:  Vitals:  Filed Vitals:   06/26/14 1434  BP: 98/60  Temp: 98.4 F (36.9 C)   General: Well-nourished, well-appearing female in no acute distress.  She is unaccompanied in clinic today.  Stoney Bang, LPN was present during physical exam for this patient.  Breasts: Bilateral breasts exposed and observed with patient standing (arms at side, arms on hips, arms on hips flexed forward, and arms over head).  No gross abnormalities including breast skin puckering or dimpling noted on observation.  Breasts symmetrical without evidence of skin redness, thickening, or peau d'orange appearance. No nipple retraction or nipple discharge noted bilaterally.  No breast nodularity palpated in bilateral breasts.  Normal fibrocystic breast changes noted in bilateral breasts. Axillary lymph nodes: No axillary lymphadenopathy bilaterally.   GU: Exam deferred. Pap smear is  up-to-date.  ASSESSMENT & PLAN:   1. Breast cancer screening: [name] has no palpable breast abnormalities on her clinical breast exam today.  She has normal fibrocystic changes noted in bilateral breasts.  She was given instructions and educational materials regarding breast self-awareness. [name] is aware of this plan and agrees with it.   2. Cervical cancer screening: She will receive colposcopy on 06/30/14 at [facility], as previously scheduled.    [name] was encouraged to ask questions and all questions were answered to her satisfaction.    Lubertha Basque, NP [facility]  [phone]

## 2014-06-30 ENCOUNTER — Other Ambulatory Visit (HOSPITAL_COMMUNITY)
Admission: RE | Admit: 2014-06-30 | Discharge: 2014-06-30 | Disposition: A | Discharge status: Home / Self Care | Payer: Self-pay | Source: Ambulatory Visit | Attending: Obstetrics & Gynecology | Admitting: Obstetrics & Gynecology

## 2014-06-30 ENCOUNTER — Encounter: Payer: Self-pay | Admitting: Family Medicine

## 2014-06-30 ENCOUNTER — Ambulatory Visit (INDEPENDENT_AMBULATORY_CARE_PROVIDER_SITE_OTHER): Payer: Self-pay | Admitting: Family Medicine

## 2014-06-30 ENCOUNTER — Other Ambulatory Visit (HOSPITAL_COMMUNITY)
Admission: RE | Admit: 2014-06-30 | Disposition: A | Discharge status: Home / Self Care | Payer: Self-pay | Source: Ambulatory Visit | Attending: Family Medicine | Admitting: Family Medicine

## 2014-06-30 VITALS — BP 105/63 | HR 74 | Temp 98.2°F | Ht 61.0 in | Wt 115.6 lb

## 2014-06-30 DIAGNOSIS — Z01812 Encounter for preprocedural laboratory examination: Secondary | ICD-10-CM

## 2014-06-30 DIAGNOSIS — IMO0002 Reserved for concepts with insufficient information to code with codable children: Secondary | ICD-10-CM

## 2014-06-30 DIAGNOSIS — N888 Other specified noninflammatory disorders of cervix uteri: Secondary | ICD-10-CM | POA: Insufficient documentation

## 2014-06-30 DIAGNOSIS — Z3202 Encounter for pregnancy test, result negative: Secondary | ICD-10-CM

## 2014-06-30 DIAGNOSIS — R896 Abnormal cytological findings in specimens from other organs, systems and tissues: Secondary | ICD-10-CM

## 2014-06-30 LAB — POCT PREGNANCY, URINE: PREG TEST UR: NEGATIVE

## 2014-06-30 NOTE — Patient Instructions (Signed)

## 2014-06-30 NOTE — Progress Notes (Signed)
Referred for colposcopy for abnormal PAP.   PAP LSIL.  No HPV Patient given informed consent, signed copy in the chart, time out was performed.  Placed in lithotomy position. Cervix viewed with speculum and colposcope after application of acetic acid.   Colposcopy adequate?  TMZ seen 360 degrees Acetowhite lesions?2-3 oclock Punctation?fine Mosaicism?  no Abnormal vasculature?  no Biopsies?2 o;clock ECC?yes  Bleeding stopped with monsel's.   Patient was given post procedure instructions.

## 2014-07-01 ENCOUNTER — Encounter: Payer: Self-pay | Admitting: General Practice

## 2014-07-09 ENCOUNTER — Telehealth: Payer: Self-pay | Admitting: *Deleted

## 2014-07-09 NOTE — Telephone Encounter (Signed)
Per Dr. Adrian Blackwater note need to call patient and tell her she had a benign pathology. Reccomendation is pap with hpv in one year.

## 2014-07-09 NOTE — Telephone Encounter (Signed)
Called [facility] and notified her pathology was negative and reccomendation is pap with hpv testing in one year.  She may call about 2 months ahead for appointment. She voices understanding.

## 2014-10-23 ENCOUNTER — Other Ambulatory Visit (HOSPITAL_COMMUNITY): Payer: Self-pay | Admitting: Nurse Practitioner

## 2014-10-23 DIAGNOSIS — Z3689 Encounter for other specified antenatal screening: Secondary | ICD-10-CM

## 2014-10-23 DIAGNOSIS — Z3A19 19 weeks gestation of pregnancy: Secondary | ICD-10-CM

## 2014-10-23 LAB — OB RESULTS CONSOLE ABO/RH: RH Type: POSITIVE

## 2014-10-23 LAB — OB RESULTS CONSOLE ANTIBODY SCREEN: ANTIBODY SCREEN: NEGATIVE

## 2014-10-23 LAB — OB RESULTS CONSOLE HEPATITIS B SURFACE ANTIGEN: HEP B S AG: NEGATIVE

## 2014-10-23 LAB — OB RESULTS CONSOLE RPR: RPR: NONREACTIVE

## 2014-10-23 LAB — OB RESULTS CONSOLE GC/CHLAMYDIA
Chlamydia: NEGATIVE
Gonorrhea: NEGATIVE

## 2014-10-23 LAB — OB RESULTS CONSOLE HIV ANTIBODY (ROUTINE TESTING): HIV: NONREACTIVE

## 2014-11-13 ENCOUNTER — Ambulatory Visit (HOSPITAL_COMMUNITY)
Admission: RE | Admit: 2014-11-13 | Discharge: 2014-11-13 | Disposition: A | Discharge status: Home / Self Care | Payer: Medicaid Other | Source: Ambulatory Visit | Attending: Nurse Practitioner | Admitting: Nurse Practitioner

## 2014-11-13 ENCOUNTER — Other Ambulatory Visit (HOSPITAL_COMMUNITY): Payer: Self-pay | Admitting: Nurse Practitioner

## 2014-11-13 DIAGNOSIS — Z3689 Encounter for other specified antenatal screening: Secondary | ICD-10-CM

## 2014-11-13 DIAGNOSIS — Z3A19 19 weeks gestation of pregnancy: Secondary | ICD-10-CM | POA: Diagnosis not present

## 2014-11-13 DIAGNOSIS — Z3687 Encounter for antenatal screening for uncertain dates: Secondary | ICD-10-CM

## 2014-11-13 DIAGNOSIS — Z36 Encounter for antenatal screening of mother: Secondary | ICD-10-CM | POA: Diagnosis present

## 2014-12-18 LAB — OB RESULTS CONSOLE RUBELLA ANTIBODY, IGM: RUBELLA: IMMUNE

## 2015-01-18 NOTE — L&D Delivery Note (Signed)
Patient is 25 y.o. G3P1011 [id number] admitted for IOL 2/2 postdates. Induced with pitocin.   Delivery Note At 12:08 PM a viable female was delivered via Vaginal, Spontaneous Delivery (Presentation: Left Occiput Anterior).  APGAR: 9, 9; weight pending.   Placenta status: Intact, Spontaneous.  Cord: 3 vessels with the following complications: None. Upon arrival patient was complete and pushing. She pushed with good maternal effort to deliver a healthy baby. Baby delivered without difficulty, was noted to have good tone and place on maternal abdomen for drying and stimulation. Delayed cord clamping performed. Placenta delivered intact with 3V cord.    Anesthesia: Local  Episiotomy: None Lacerations: 1st degree;Perineal Suture Repair: vicryl Est. Blood Loss (mL): 250  Mom to postpartum.  Baby to Couplet care / Skin to Skin.   Caryl AdaJazma Philipe Laswell, DO 04/14/2015, 12:35 PM PGY-2, [facility]

## 2015-03-14 LAB — OB RESULTS CONSOLE GBS: GBS: NEGATIVE

## 2015-04-07 ENCOUNTER — Other Ambulatory Visit (HOSPITAL_COMMUNITY): Payer: Self-pay | Admitting: Physician Assistant

## 2015-04-07 DIAGNOSIS — O480 Post-term pregnancy: Secondary | ICD-10-CM

## 2015-04-09 ENCOUNTER — Encounter (HOSPITAL_COMMUNITY): Payer: Self-pay | Admitting: *Deleted

## 2015-04-09 ENCOUNTER — Telehealth (HOSPITAL_COMMUNITY): Payer: Self-pay | Admitting: *Deleted

## 2015-04-09 NOTE — Telephone Encounter (Signed)
Preadmission screen Interpreter number [phone]

## 2015-04-10 ENCOUNTER — Ambulatory Visit (HOSPITAL_COMMUNITY)
Admission: RE | Admit: 2015-04-10 | Discharge: 2015-04-10 | Disposition: A | Discharge status: Home / Self Care | Payer: Medicaid Other | Source: Ambulatory Visit | Attending: Physician Assistant | Admitting: Physician Assistant

## 2015-04-10 DIAGNOSIS — Z3A40 40 weeks gestation of pregnancy: Secondary | ICD-10-CM | POA: Insufficient documentation

## 2015-04-10 DIAGNOSIS — O480 Post-term pregnancy: Secondary | ICD-10-CM | POA: Insufficient documentation

## 2015-04-14 ENCOUNTER — Inpatient Hospital Stay (HOSPITAL_COMMUNITY)
Admission: RE | Admit: 2015-04-14 | Discharge: 2015-04-15 | DRG: 775 | Disposition: A | Discharge status: Home / Self Care | Payer: Medicaid Other | Source: Ambulatory Visit | Attending: Family Medicine | Admitting: Family Medicine

## 2015-04-14 ENCOUNTER — Encounter (HOSPITAL_COMMUNITY): Payer: Self-pay

## 2015-04-14 DIAGNOSIS — Z3A41 41 weeks gestation of pregnancy: Secondary | ICD-10-CM

## 2015-04-14 DIAGNOSIS — O480 Post-term pregnancy: Principal | ICD-10-CM | POA: Diagnosis present

## 2015-04-14 LAB — CBC
HCT: 29.8 % — ABNORMAL LOW (ref 36.0–46.0)
Hemoglobin: 9.9 g/dL — ABNORMAL LOW (ref 12.0–15.0)
MCH: 26.1 pg (ref 26.0–34.0)
MCHC: 33.2 g/dL (ref 30.0–36.0)
MCV: 78.4 fL (ref 78.0–100.0)
Platelets: 206 10*3/uL (ref 150–400)
RBC: 3.8 MIL/uL — ABNORMAL LOW (ref 3.87–5.11)
RDW: 15.6 % — ABNORMAL HIGH (ref 11.5–15.5)
WBC: 6.9 10*3/uL (ref 4.0–10.5)

## 2015-04-14 LAB — TYPE AND SCREEN
ABO/RH(D): A POS
Antibody Screen: NEGATIVE

## 2015-04-14 MED ORDER — LIDOCAINE HCL (PF) 1 % IJ SOLN
30.0000 mL | INTRAMUSCULAR | Status: DC | PRN
  Filled 2015-04-14: qty 30

## 2015-04-14 MED ORDER — ONDANSETRON HCL 4 MG/2ML IJ SOLN: 4.0000 mg | INTRAMUSCULAR | Status: DC | PRN

## 2015-04-14 MED ORDER — PRENATAL MULTIVITAMIN CH
1.0000 | ORAL_TABLET | Freq: Every day | ORAL | Status: DC
  Administered 2015-04-15: 1 via ORAL
  Filled 2015-04-14: qty 1

## 2015-04-14 MED ORDER — ZOLPIDEM TARTRATE 5 MG PO TABS: 5.0000 mg | ORAL_TABLET | Freq: Every evening | ORAL | Status: DC | PRN

## 2015-04-14 MED ORDER — OXYTOCIN 10 UNIT/ML IJ SOLN
2.5000 [IU]/h | INTRAVENOUS | Status: DC
  Administered 2015-04-14: 500 m[IU]/min via INTRAVENOUS
  Administered 2015-04-14: 2 m[IU]/min via INTRAVENOUS
  Filled 2015-04-14: qty 10

## 2015-04-14 MED ORDER — ONDANSETRON HCL 4 MG/2ML IJ SOLN: 4.0000 mg | Freq: Four times a day (QID) | INTRAMUSCULAR | Status: DC | PRN

## 2015-04-14 MED ORDER — OXYTOCIN 10 UNIT/ML IJ SOLN
2.5000 [IU]/h | INTRAVENOUS | Status: DC
  Administered 2015-04-14: 4 m[IU]/min via INTRAVENOUS

## 2015-04-14 MED ORDER — TETANUS-DIPHTH-ACELL PERTUSSIS 5-2.5-18.5 LF-MCG/0.5 IM SUSP: 0.5000 mL | Freq: Once | INTRAMUSCULAR | Status: DC

## 2015-04-14 MED ORDER — DIPHENHYDRAMINE HCL 25 MG PO CAPS: 25.0000 mg | ORAL_CAPSULE | Freq: Four times a day (QID) | ORAL | Status: DC | PRN

## 2015-04-14 MED ORDER — BENZOCAINE-MENTHOL 20-0.5 % EX AERO
1.0000 "application " | INHALATION_SPRAY | CUTANEOUS | Status: DC | PRN
  Administered 2015-04-14: 1 via TOPICAL
  Filled 2015-04-14: qty 56

## 2015-04-14 MED ORDER — ACETAMINOPHEN 325 MG PO TABS
650.0000 mg | ORAL_TABLET | ORAL | Status: DC | PRN
  Administered 2015-04-14: 650 mg via ORAL
  Filled 2015-04-14: qty 2

## 2015-04-14 MED ORDER — DIBUCAINE 1 % RE OINT: 1.0000 "application " | TOPICAL_OINTMENT | RECTAL | Status: DC | PRN

## 2015-04-14 MED ORDER — SENNOSIDES-DOCUSATE SODIUM 8.6-50 MG PO TABS
2.0000 | ORAL_TABLET | ORAL | Status: DC
  Administered 2015-04-14: 2 via ORAL
  Filled 2015-04-14: qty 2

## 2015-04-14 MED ORDER — SIMETHICONE 80 MG PO CHEW: 80.0000 mg | CHEWABLE_TABLET | ORAL | Status: DC | PRN

## 2015-04-14 MED ORDER — FLEET ENEMA 7-19 GM/118ML RE ENEM: 1.0000 | ENEMA | RECTAL | Status: DC | PRN

## 2015-04-14 MED ORDER — IBUPROFEN 600 MG PO TABS
600.0000 mg | ORAL_TABLET | Freq: Four times a day (QID) | ORAL | Status: DC
  Administered 2015-04-14 – 2015-04-15 (×4): 600 mg via ORAL
  Filled 2015-04-14 (×4): qty 1

## 2015-04-14 MED ORDER — FENTANYL CITRATE (PF) 100 MCG/2ML IJ SOLN
INTRAMUSCULAR | Status: AC
  Filled 2015-04-14: qty 2

## 2015-04-14 MED ORDER — CITRIC ACID-SODIUM CITRATE 334-500 MG/5ML PO SOLN: 30.0000 mL | ORAL | Status: DC | PRN

## 2015-04-14 MED ORDER — OXYCODONE-ACETAMINOPHEN 5-325 MG PO TABS: 1.0000 | ORAL_TABLET | ORAL | Status: DC | PRN

## 2015-04-14 MED ORDER — FENTANYL CITRATE (PF) 100 MCG/2ML IJ SOLN
100.0000 ug | INTRAMUSCULAR | Status: DC | PRN
  Administered 2015-04-14: 100 ug via INTRAVENOUS

## 2015-04-14 MED ORDER — WITCH HAZEL-GLYCERIN EX PADS: 1.0000 "application " | MEDICATED_PAD | CUTANEOUS | Status: DC | PRN

## 2015-04-14 MED ORDER — LACTATED RINGERS IV SOLN
INTRAVENOUS | Status: DC
  Administered 2015-04-14: 1000 mL via INTRAVENOUS

## 2015-04-14 MED ORDER — ACETAMINOPHEN 325 MG PO TABS: 650.0000 mg | ORAL_TABLET | ORAL | Status: DC | PRN

## 2015-04-14 MED ORDER — ONDANSETRON HCL 4 MG PO TABS: 4.0000 mg | ORAL_TABLET | ORAL | Status: DC | PRN

## 2015-04-14 MED ORDER — LANOLIN HYDROUS EX OINT: TOPICAL_OINTMENT | CUTANEOUS | Status: DC | PRN

## 2015-04-14 MED ORDER — OXYCODONE-ACETAMINOPHEN 5-325 MG PO TABS: 2.0000 | ORAL_TABLET | ORAL | Status: DC | PRN

## 2015-04-14 MED ORDER — TERBUTALINE SULFATE 1 MG/ML IJ SOLN
0.2500 mg | Freq: Once | INTRAMUSCULAR | Status: DC | PRN
  Filled 2015-04-14: qty 1

## 2015-04-14 MED ORDER — LACTATED RINGERS IV SOLN: 500.0000 mL | INTRAVENOUS | Status: DC | PRN

## 2015-04-14 MED ORDER — OXYTOCIN BOLUS FROM INFUSION: 500.0000 mL | INTRAVENOUS | Status: DC

## 2015-04-14 MED ORDER — TERBUTALINE SULFATE 1 MG/ML IJ SOLN
0.2500 mg | Freq: Once | INTRAMUSCULAR | Status: DC | PRN
Start: 2015-04-14 — End: 2015-04-14
  Filled 2015-04-14: qty 1

## 2015-04-14 NOTE — Lactation Note (Signed)
This note was copied from a baby's chart. Lactation Consultation Note  Patient Name: Michelle Heath WUXLK'GToday's Date: 04/14/2015 Reason for consult: Initial assessment Baby at 9 hr of life and mom reports bilateral sore nipples. R nipple has a horizontal compression stripe and a hairline red crack at the top of the nipple shaft were the areola/npple base meet. L nipple has a hairline red crack at top of the areola at the base of the nipple. She has a NS and comfort gels. Given a Harmony for use after use of feedings with the NS. Encouraged mom to call at next feeding to get help with latch. Baby was sleeping at this visit and mom reported that baby had just fed. She had very sore nipples with her 25 yr old and needed to use the NS for 6 months. Discussed baby behavior, feeding frequency, pumping, baby belly size, voids, wt loss, breast changes, and nipple care. Given lactation handouts. Aware of OP services and support group.     Maternal Data Has patient been taught Hand Expression?: Yes Does the patient have breastfeeding experience prior to this delivery?: Yes  Feeding    LATCH Score/Interventions                      Lactation Tools Discussed/Used WIC Program: Yes Pump Review: Setup, frequency, and cleaning;Milk Storage Initiated by:: ES Date initiated:: 04/14/15   Consult Status Consult Status: Follow-up Date: 04/14/15 Follow-up type: In-patient    Michelle Heath 04/14/2015, 9:17 PM

## 2015-04-14 NOTE — H&P (Signed)
Michelle Heath is a 25 y.o. female presenting for induction of labor for postdates. Uneventful prenatal course.GBS negative Maternal Medical History:  Reason for admission: Induction of labor for postdates  Contractions: Frequency: rare.    Fetal activity: Perceived fetal activity is normal.      Clinic Health Dept Prenatal Labs  Dating lmp c/w u/s Blood type: A/Positive/-- (10/06 0000)   Genetic Screen 1 Screen:    AFP:     Quad:     NIPS: Antibody:Negative (10/06 0000)  Anatomic [place]  Rubella: Immune (12/01 0000)  GTT Early:   96       Third trimester:  RPR: Nonreactive (10/06 0000)   Flu vaccine done HBsAg: Negative (10/06 0000)   TDaP vaccine                                               Rhogam: HIV: Non-reactive (10/06 0000)   Baby Food         breast                                      [id number] (02/25 0000)(For PCN allergy, check sensitivities)  Contraception  Pap:  Circumcision    Pediatrician    Support Person      OB History    Gravida Para Term Preterm AB TAB SAB Ectopic Multiple Living   0 1 1 0 0 0 1     Past Medical History  Diagnosis Date  . No pertinent past medical history   . Anemia    Past Surgical History  Procedure Laterality Date  . Facial reconstruction surgery     Family History: family history includes Diabetes in her paternal grandfather. There is no history of Other. Social History:  reports that she has never smoked. She has never used smokeless tobacco. She reports that she does not drink alcohol or use illicit drugs.   Prenatal Transfer Tool  Maternal Diabetes: No Genetic Screening: Declined Maternal Ultrasounds/Referrals: Normal Fetal Ultrasounds or other Referrals:  None Maternal Substance Abuse:  No Significant Maternal Medications:  None Significant Maternal Lab Results:  None Other Comments:  None  Review of Systems  Constitutional: Negative for fever.  All other systems reviewed and are  negative.   Dilation: 3.5 Effacement (%): 70 Station: -1 Exam by:: Clemmons Blood pressure 114/61, pulse 84, temperature 98.1 F (36.7 C), temperature source Oral, resp. rate 16, height  (1.549 m), weight 138 lb (62.596 kg), last menstrual period 07/01/2014. Maternal Exam:  Uterine Assessment: Contraction strength is mild.  Contraction frequency is rare.   Abdomen: Patient reports no abdominal tenderness. Introitus: Normal vulva. Normal vagina.  Pelvis: adequate for delivery.   Cervix: Cervix evaluated by digital exam.     Fetal Exam Fetal Monitor Review: Mode: ultrasound.   Variability: moderate (6-25 bpm).   Pattern: accelerations present and no decelerations.    Fetal State Assessment: Category I - tracings are normal.     Physical Exam  Nursing note and vitals reviewed. Constitutional: She is oriented to person, place, and time. She appears well-developed and well-nourished.  HENT:  Head: Normocephalic and atraumatic.  Neck: Normal range of motion. Neck supple.  Cardiovascular: Normal rate.   Respiratory: Effort normal. No respiratory distress.  GI:  Soft. There is no tenderness.  Genitourinary: Vagina normal and uterus normal.  Musculoskeletal: Normal range of motion.  Neurological: She is alert and oriented to person, place, and time.  Skin: Skin is warm and dry.  Psychiatric: She has a normal mood and affect. Her behavior is normal. Judgment and thought content normal.    Prenatal labs: ABO, Rh: A/Positive/-- (10/06 0000) Antibody: Negative (10/06 0000) Rubella: Immune (12/01 0000) RPR: Nonreactive (10/06 0000)  HBsAg: Negative (10/06 0000)  HIV: Non-reactive (10/06 0000)  GBS: Negative (02/25 0000)   Assessment/Plan: IUP @ 41+0 weeks Induction postdates Pitocin Nitrous Oxide for pain control Anticipate vaginal delivery   Clemmons,Lori Grissett 04/14/2015, 7:26 AM

## 2015-04-14 NOTE — Consults (Signed)
  Anesthesia Pain Consult Note  Patient: Michelle Heath, 25 y.o., female  Consult Requested by: Levie HeritageJacob J Stinson, DO  Reason for Consult: CRNA pain rounding  Level of Consciousness: alert  Pain: 3 /10 Pain Goal:10  Last Vitals:  Filed Vitals:   04/14/15 0738 04/14/15 0833  BP: 111/65 104/62  Pulse: 74 75  Temp:    Resp:      Plan: Epidural infusion for pain control.   Irisha Grandmaison 04/14/2015

## 2015-04-15 LAB — CBC
HCT: 27.3 % — ABNORMAL LOW (ref 36.0–46.0)
Hemoglobin: 9.1 g/dL — ABNORMAL LOW (ref 12.0–15.0)
MCH: 25.9 pg — AB (ref 26.0–34.0)
MCHC: 33.3 g/dL (ref 30.0–36.0)
MCV: 77.8 fL — AB (ref 78.0–100.0)
PLATELETS: 192 10*3/uL (ref 150–400)
RBC: 3.51 MIL/uL — AB (ref 3.87–5.11)
RDW: 15.5 % (ref 11.5–15.5)
WBC: 10 10*3/uL (ref 4.0–10.5)

## 2015-04-15 LAB — RPR: RPR Ser Ql: NONREACTIVE

## 2015-04-15 MED ORDER — IBUPROFEN 600 MG PO TABS: 600.0000 mg | ORAL_TABLET | Freq: Four times a day (QID) | ORAL | Status: DC

## 2015-04-15 NOTE — Lactation Note (Signed)
This note was copied from a baby's chart. Lactation Consultation Note  Baby is 24 hours of life and is not BF well. BF is very painful for mom. Pain is 6/10 on the pain scale when attached to the bare breast and a 3/10 when using a NS.  SHowed mom how to do jaw, massage neck ROM and asymmetrical latch and she reported that latch was a bit more comfortable. Her nipples are cracked and she is using comfort gels and expressed breast milk to aid in healing.  Breast compression recommended to aid with transfer.  Baby has some difficulty moving her tongue. Dr Sharen HonesEttafaugh evaluated it. Plan is to follow-up with the ped tomorrow and lactation on Friday. Patient Name: Michelle Heath [name] ZOXWR'UToday's Date: 04/15/2015     Maternal Data    Feeding Feeding Type: Breast Fed Length of feed: 30 min  LATCH Score/Interventions                      Lactation Tools Discussed/Used     Consult Status      Soyla DryerJoseph, Jahi Roza 04/15/2015, 12:36 PM

## 2015-04-15 NOTE — Discharge Instructions (Signed)
Parto vaginal, Cuidados posteriores  °(Vaginal Delivery, Care After) °Siga estas instrucciones durante las próximas semanas. Estas indicaciones para el alta le proporcionan información general acerca de cómo deberá cuidarse después del parto. El médico también podrá darle instrucciones específicas. El tratamiento ha sido planificado según las prácticas médicas actuales, pero en algunos casos pueden ocurrir problemas. Comuníquese con el médico si tiene algún problema o tiene preguntas al volver a su casa.  °INSTRUCCIONES PARA EL CUIDADO EN EL HOGAR  °· Tome sólo medicamentos de venta libre o recetados, según las indicaciones del médico o del farmacéutico. °· No beba alcohol, especialmente si está amamantando o toma analgésicos. °· No mastique tabaco ni fume. °· No consuma drogas. °· Continúe con un adecuado cuidado perineal. El buen cuidado perineal incluye: °¨ Higienizarse de adelante hacia atrás. °¨ Mantener la zona perineal limpia. °· No use tampones ni duchas vaginales hasta que su médico la autorice. °· Dúchese, lávese el cabello y tome baños de inmersión según las indicaciones de su médico. °· Utilice un sostén que le ajuste bien y que brinde buen soporte a sus mamas. °· Consuma alimentos saludables. °· Beba suficiente líquido para mantener la orina clara o de color amarillo pálido. °· Consuma alimentos ricos en fibra como cereales y panes integrales, arroz, frijoles y frutas y verduras frescas todos los días. Estos alimentos pueden ayudarla a prevenir o aliviar el estreñimiento. °· Siga las recomendaciones de su médico relacionadas con la reanudación de actividades como subir escaleras, conducir automóviles, levantar objetos, hacer ejercicios o viajar. °· Hable con su médico acerca de reanudar la actividad sexual. Volver a la actividad sexual depende del riesgo de infección, la velocidad de la curación y la comodidad y su deseo de reanudarla. °· Trate de que alguien la ayude con las actividades del hogar y con  el recién nacido al menos durante un par de días después de salir del hospital. °· Descanse todo lo que pueda. Trate de descansar o tomar una siesta mientras el bebé está durmiendo. °· Aumente sus actividades gradualmente. °· Cumpla con todas las visitas de control programadas para después del parto. Es muy importante asistir a todas las citas programadas de seguimiento. En estas citas, su médico va a controlarla para asegurarse de que esté sanando física y emocionalmente. °SOLICITE ATENCIÓN MÉDICA SI:  °· Elimina coágulos grandes por la vagina. Guarde algunos coágulos para mostrarle al médico. °· Tiene una secreción con feo olor que proviene de la vagina. °· Tiene dificultad para orinar. °· Orina con frecuencia. °· Siente dolor al orinar. °· Nota un cambio en sus movimientos intestinales. °· Aumenta el enrojecimiento, el dolor o la hinchazón en la zona de la incisión vaginal (episiotomía) o el desgarro vaginal. °· Tiene pus que drena por la episiotomía o el desgarro vaginal. °· La episiotomía o el desgarro vaginal se abren. °· Sus mamas le duelen, están duras o enrojecidas. °· Sufre un dolor intenso de cabeza. °· Tiene visión borrosa o ve manchas. °· Se siente triste o deprimida. °· Tiene pensamientos acerca de lastimarse o dañar al recién nacido. °· Tiene preguntas acerca de su cuidado personal, el cuidado del recién nacido o acerca de los medicamentos. °· Se siente mareada o sufre un desmayo. °· Tiene una erupción. °· Tiene náuseas o vómitos. °· Usted amamantó al bebé y no ha tenido su período menstrual dentro de las 12 semanas después de dejar de amamantar. °· No amamanta al bebé y no tuvo su período menstrual en las últimas 12° semanas después del   parto. °· Tiene fiebre. °SOLICITE ATENCIÓN MÉDICA DE INMEDIATO SI:  °· Siente dolor persistente. °· Siente dolor en el pecho. °· Le falta el aire. °· Se desmaya. °· Siente dolor en la pierna. °· Siente dolor en el estómago. °· El sangrado vaginal satura dos o más  apósitos en 1 hora. °  °Esta información no tiene como fin reemplazar el consejo del médico. Asegúrese de hacerle al médico cualquier pregunta que tenga. °  °Document Released: 01/03/2005 Document Revised: 09/24/2014 °Elsevier Interactive Patient Education ©2016 Elsevier Inc. ° °

## 2015-04-15 NOTE — Progress Notes (Signed)
Post Partum Day 1 Subjective: no complaints, up ad lib, voiding and tolerating PO, no complaints of pain at this time  Objective: Blood pressure 109/64, pulse 66, temperature 98.1 F (36.7 C), temperature source Oral, resp. rate 16, height 5\' 1"  (1.549 m), weight 62.596 kg (138 lb), last menstrual period 06/09/2014, SpO2 99 %, unknown if currently breastfeeding.  Physical Exam:  General: alert, cooperative, appears stated age and no distress Lochia: appropriate Uterine Fundus: firm DVT Evaluation: No evidence of DVT seen on physical exam.   Recent Labs  04/14/15 0730 04/15/15 0540  HGB 9.9* 9.1*  HCT 29.8* 27.3*    Assessment/Plan: Discharge home, Breastfeeding and Contraception OCPs   LOS: 1 day   Cephus ShellingAndrew P Carsyn Taubman, PA-S 04/15/2015, 7:26 AM

## 2015-04-15 NOTE — Discharge Summary (Signed)
OB Discharge Summary     Patient Name: Michelle Heath DOB: 06/18/1990 MRN: [id number]  Date of admission: 04/14/2015 Delivering MD: Pincus Large   Date of discharge: 04/15/2015  Admitting diagnosis: INDUCTION Intrauterine pregnancy: [redacted]w[redacted]d     Secondary diagnosis:  Active Problems:   Labor and delivery indication for care or intervention   NSVD (normal spontaneous vaginal delivery)  Additional problems:  None      Discharge diagnosis: Term Pregnancy Delivered                                                                                                Post partum procedures:none  Augmentation: Pitocin  Complications: None  Hospital course:  Induction of Labor With Vaginal Delivery   25 y.o. yo [id number] at [redacted]w[redacted]d was admitted to the hospital 04/14/2015 for induction of labor.  Indication for induction: Postdates.  Patient had an uncomplicated labor course as follows: Membrane Rupture Time/Date: 12:00 PM ,04/14/2015   Intrapartum Procedures: Episiotomy: None [1]                                         Lacerations:  1st degree [2];Perineal [11]  Patient had delivery of a Viable infant.  Information for the patient's newborn:  Michelle, [place] Girl Heath [[id number]]  Delivery Method: Vaginal, Spontaneous Delivery (Filed from Delivery Summary)   04/14/2015  Details of delivery can be found in separate delivery note.  Patient had a routine postpartum course. Patient is discharged home 04/15/2015.   Physical exam  Filed Vitals:   04/14/15 1400 04/14/15 1516 04/14/15 1915 04/15/15 0250  BP: 105/67 114/64 100/59 109/64  Pulse: 66 66 75 66  Temp: 98.7 F (37.1 C) 98.5 F (36.9 C) 98.1 F (36.7 C) 98.1 F (36.7 C)  TempSrc: Oral Oral Oral Oral  Resp: Height:      Weight:      SpO2:   99% 99%   General: alert, cooperative and no distress Lochia: appropriate Uterine Fundus: firm Incision: N/A DVT Evaluation: Negative Homan's sign. No cords or calf  tenderness. No significant calf/ankle edema. Labs: Lab Results  Component Value Date   WBC 10.0 04/15/2015   HGB 9.1* 04/15/2015   HCT 27.3* 04/15/2015   MCV 77.8* 04/15/2015   PLT 192 04/15/2015   No flowsheet data found.  Discharge instruction: per After Visit Summary and "Baby and Me Booklet".  After visit meds:    Medication List    ASK your doctor about these medications        ferrous sulfate 325 (65 FE) MG tablet  Take 325 mg by mouth 2 (two) times daily with a meal.     prenatal multivitamin Tabs tablet  Take 1 tablet by mouth daily at 12 noon.        Diet: routine diet  Activity: Advance as tolerated. Pelvic rest for 6 weeks.   Outpatient follow up:6 weeks Follow up Appt:No future appointments. Follow up Visit:No Follow-up on file.  Postpartum contraception: Combination OCPs  Newborn Data: Live born female  Birth Weight: 7 lb (3175 g) APGAR: 9, 9  Baby Feeding: Breast Disposition:home with mother   04/15/2015 Beaulah Dinninghristina M Gambino, MD   OB FELLOW DISCHARGE ATTESTATION  I have seen and examined this patient and agree with above documentation in the resident's note.   Silvano BilisNoah B Akilah Cureton, MD 7:42 PM

## 2015-04-17 ENCOUNTER — Ambulatory Visit (HOSPITAL_COMMUNITY)
Admission: RE | Admit: 2015-04-17 | Discharge: 2015-04-17 | Disposition: A | Discharge status: Home / Self Care | Payer: Medicaid Other | Source: Ambulatory Visit | Attending: Family Medicine | Admitting: Family Medicine

## 2015-04-17 NOTE — Lactation Note (Signed)
Lactation Consult; Weight today 6- 11.8 oz 3058 g Mom here today due to sore nipples and using a NS here in hospital. Mom reports milk volume started increasing last night. Baby has not fed in 4 hours and mom's breasts are very full. Baby latched well without NS and mom reports slight pain with initial latch but then eases off. Eimi came off the breast a few times choking because milk was coming too fast. Mom reports breast is softer after nursing. Encouraged to nurse first breast until softer then offer second breast. May need to pump prior to nursing if breast is too full. Encouraged ice packs for 15 min between nursing. No further questions at present. Reviewed [facility] as resource for support after DC. To call prn  Mother's reason for visit:  Trouble with breast feeding- sore nipples Visit Type:  Feeding assessment Appointment Notes:  Using NS Consult:  Initial Lactation Consultant:  Audry RilesWeeks, Gwendlyon Zumbro D  ________________________________________________________________________ Baby's Name: Revonda HumphreyEimi Juliette Alarcon Alva Date of Birth: 04/14/2017 [facility] Gender: female Gestational Age: 767w0d (At Birth) Birth Weight: 7 lb (3175 g) Weight at Discharge: Weight: 6 lb 13.9 oz (3115 g)[facility]   [facility] 1208 04/14/15 2315  Weight: 7 lb (3175 g) 6 lb 13.9 oz (3115 g)        ________________________________________________________________________  Mother's Name: Michelle Heath Type of delivery:  vag Breastfeeding Experience:  P2 Breast fed first baby for 6 months    ________________________________________________________________________  Breastfeeding History (Post Discharge)  Frequency of breastfeeding:  q 2-3 hours- very frequently thought the night  Duration of feeding:  20-30 min    Pumping  Type of pump:  Manual and Medela pump in style Frequency:  q feeding Volume:1-2 oz  Infant  Intake and Output Assessment  Voids:  5-6 in 24 hrs.  Color:  Clear yellow Stools:  3 in 24 hrs.  ________________________________________________________________________  Maternal Breast Assessment  Breast:  Full Nipple:  Erect  _______________________________________________________________________ Feeding Assessment/Evaluation  Initial feeding assessment:  Infant's oral assessment:  Variance  ? tongue restriction  Positioning:  Football Left breast  LATCH documentation:  Latch:  2 = Grasps breast easily, tongue down, lips flanged, rhythmical sucking.  Audible swallowing:  2 = Spontaneous and intermittent  Type of nipple:  2 = Everted at rest and after stimulation  Comfort (Breast/Nipple):  1 = Filling, red/small blisters or bruises, mild/mod discomfort  Hold (Positioning):  1 = Assistance needed to correctly position infant at breast and maintain latch  LATCH score:  8  Attached assessment:  Deep  Lips flanged:  Yes.    Lips untucked:  No.  Suck assessment:  Nutritive   Pre-feed weight:  3058 g  6-  11.8 oz .Post-feed weight:  3122 g6- 14.1 oz Amount transferred:  64 ml Amount supplemented:   0 ml    Total amount pumped post feed:  3 oz total  Total amount transferred:  64 ml

## 2017-01-31 IMAGING — US US MFM OB COMP +14 WKS
1 series · 14 of 28 positions shown · non-contrast
Comparison: none

[Series 1: us mfm ob comp +14 wks · 102 acquisitions, 14 frames shown]
[im 4/102]
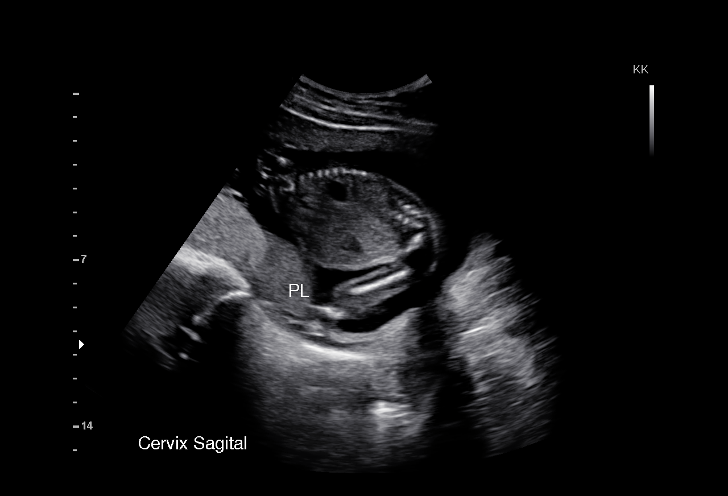
[im 12/102]
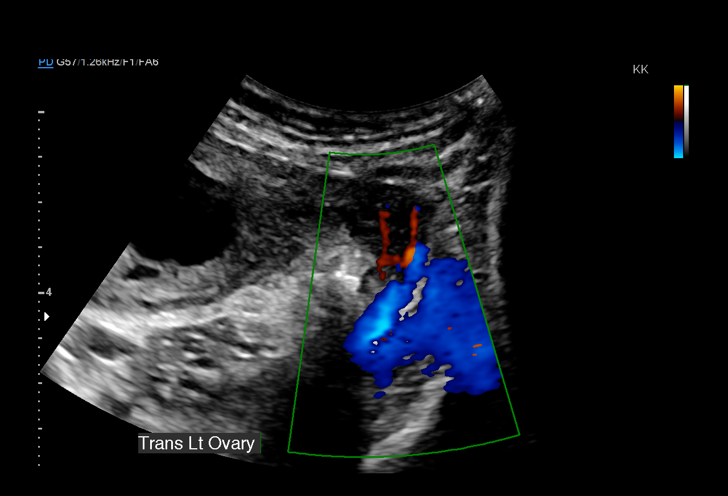
[im 19/102]
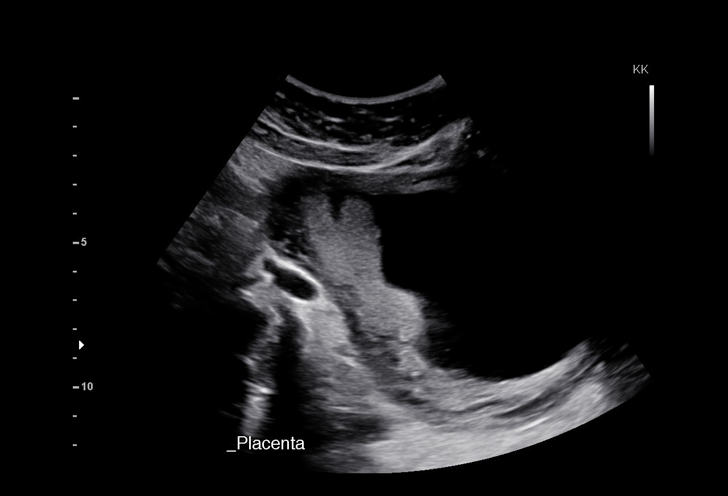
[im 27/102]
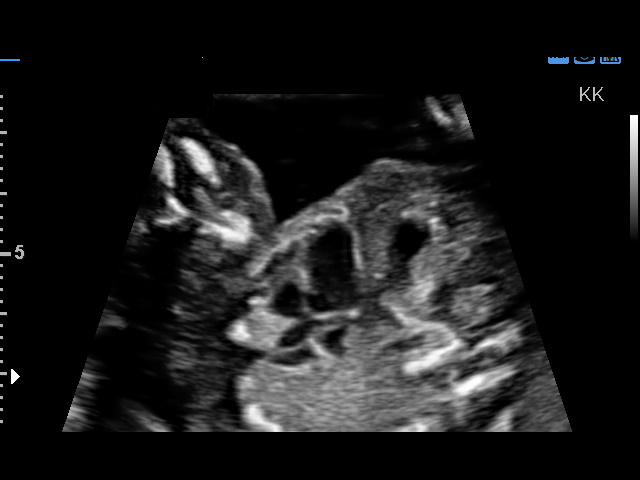
[im 34/102]
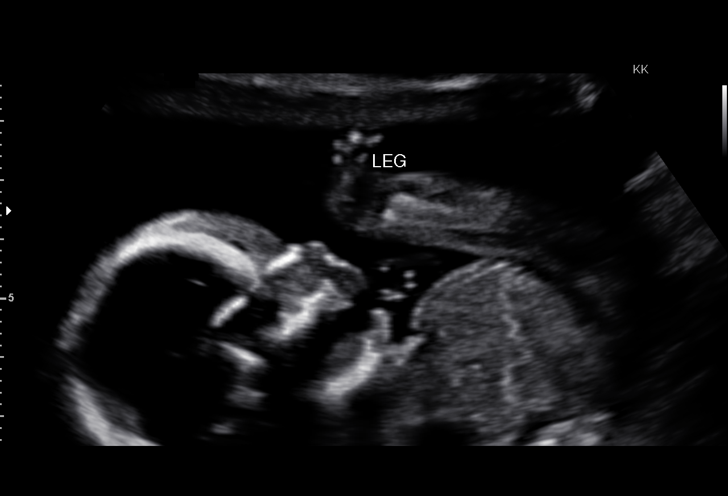
[im 42/102]
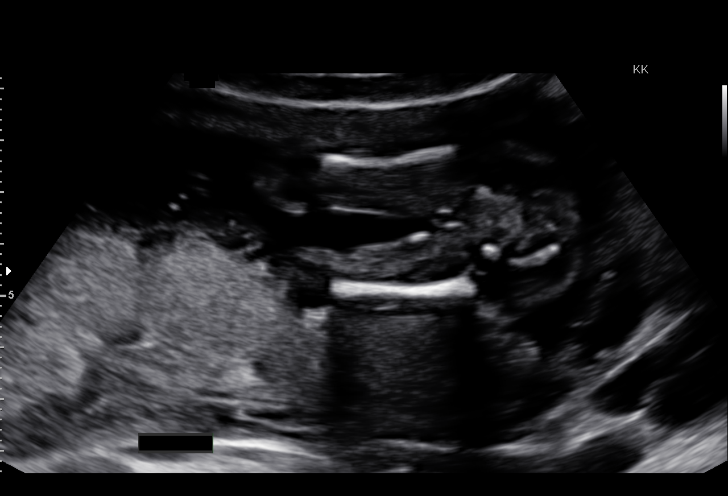
[im 49/102]
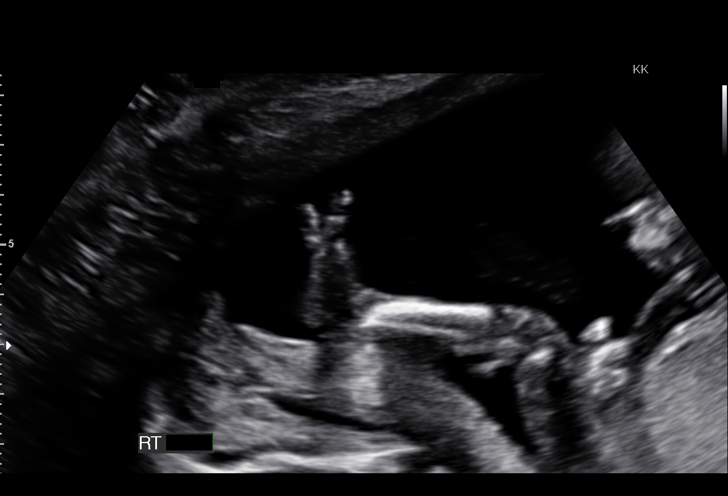
[im 57/102]
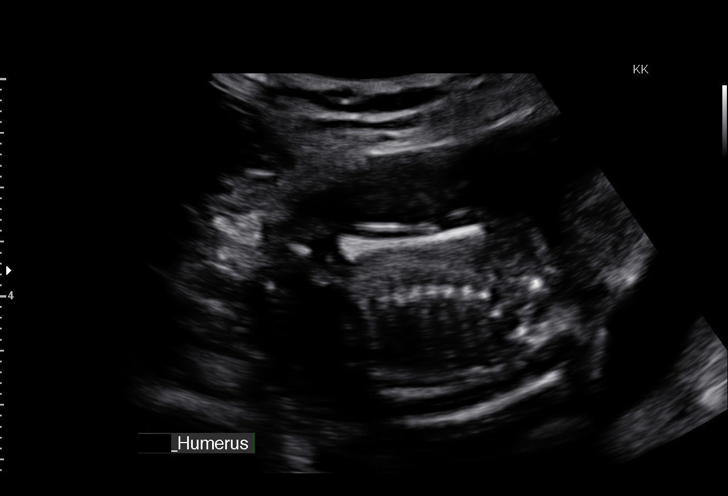
[im 64/102]
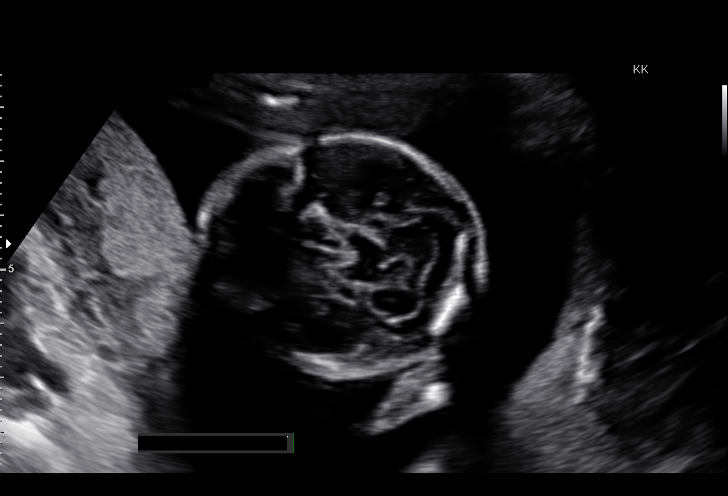
[im 72/102]
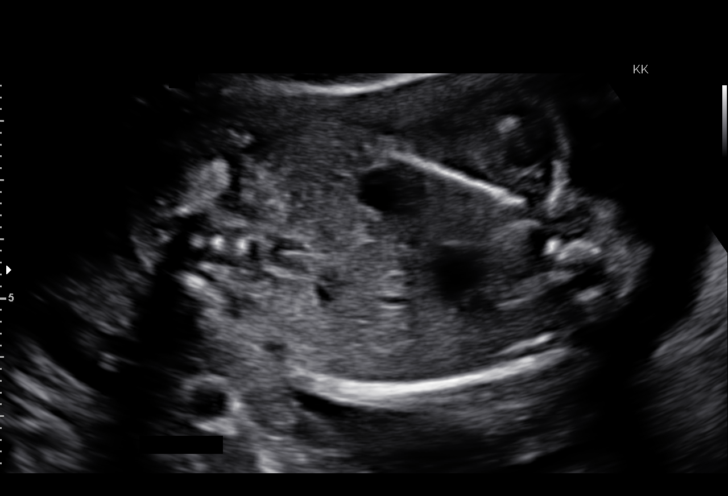
[im 79/102]
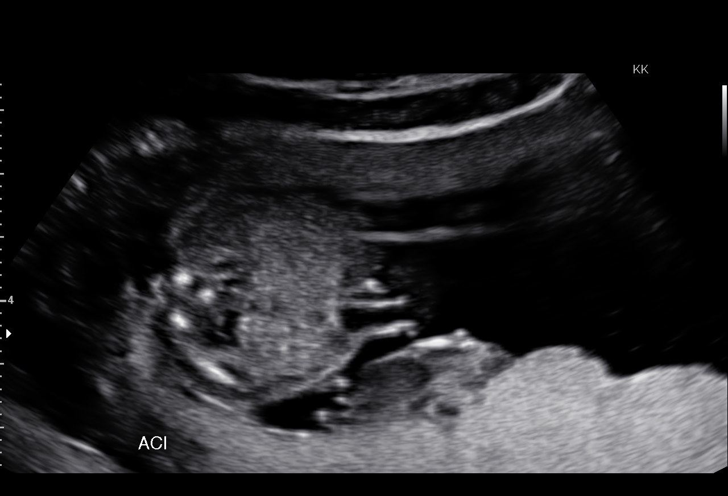
[im 87/102]
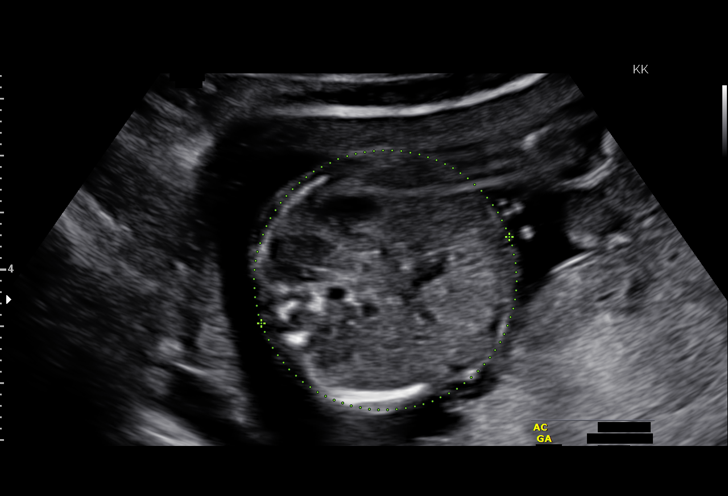
[im 94/102]
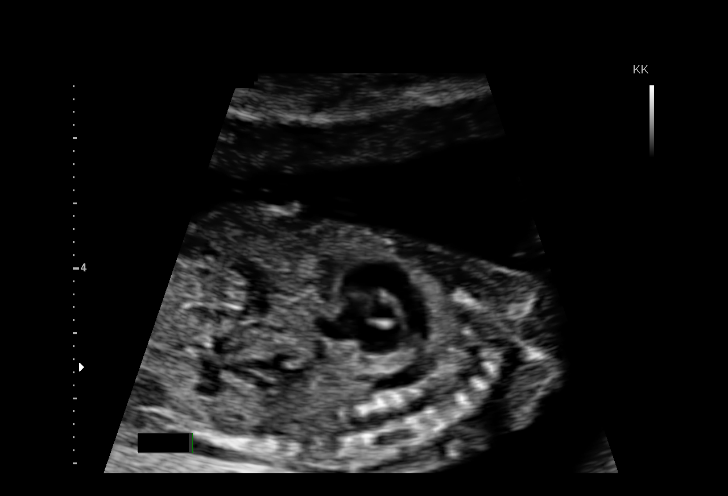
[im 102/102]
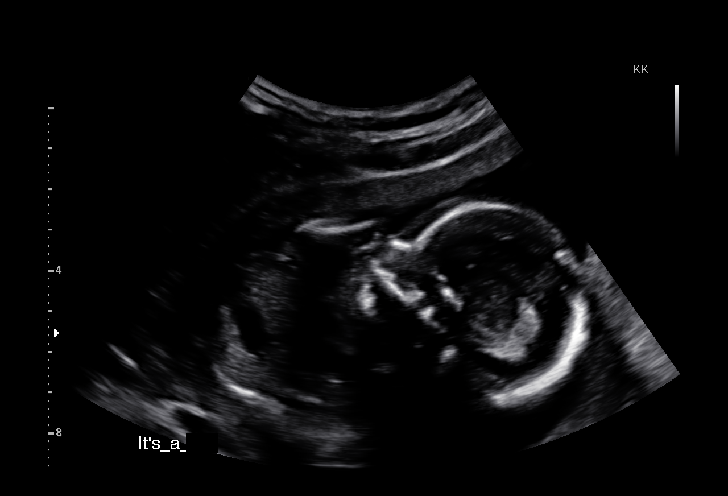

[14 of 28 positions shown; findings below may reference images not displayed]

OBSTETRICS REPORT
(Signed Final 11/13/2014 [DATE])

AUJLA

Service(s) Provided

Indications

Basic anatomic survey                                 Z36
Uncertain LMP,  Establish Gestational Age             Z36
19 weeks gestation of pregnancy
Fetal Evaluation

Num Of Fetuses:    1
Fetal Heart Rate:  142                          bpm
Cardiac Activity:  Observed
Presentation:      Variable
Placenta:          Posterior, above cervical
os
P. Cord            Visualized
Insertion:

Amniotic Fluid
AFI FV:      Subjectively within normal limits
Larg Pckt:    4.6  cm
Biometry

BPD:     43.5   mm    G. Age:  19w 1d                CI:        77.34    70 - 86
FL/HC:       18.0   16.1 -
18.3
HC:     156.6   mm    G. Age:  18w 5d       14   %   HC/AC:       1.07   1.09 -
1.39
AC:       146   mm    G. Age:  19w 6d       65   %   FL/BPD:
FL:      28.2   mm    G. Age:  18w 5d       22   %   FL/AC:       19.3   20 - 24
NFT:       4.2  mm

Est. FW:     281   gm    0 lb 10 oz     45  %
Gestational Age

LMP:           19w 2d        Date:  07/01/14                 EDD:    04/07/15
U/S Today:     19w 1d                                        EDD:    04/08/15
Best:          19w 2d     Det. By:  LMP  (07/01/14)          EDD:    04/07/15
Anatomy
Cranium:          Appears normal         Aortic Arch:       Not well visualized
Fetal Cavum:      Appears normal         Ductal Arch:       Appears normal
Ventricles:       Appears normal         Diaphragm:         Appears normal
Choroid Plexus:   Appears normal         Stomach:           Appears normal, left
sided
Cerebellum:       Appears normal         Abdomen:           Appears normal
Posterior Fossa:  Appears normal         Abdominal Wall:    Appears nml (cord
insert, abd wall)
Nuchal Fold:      Appears normal         Cord Vessels:      Appears normal (3
vessel cord)
Face:             Orbits nl; profile not Kidneys:           Appear normal
well visualized
Lips:             Appears normal         Bladder:           Appears normal
Heart:            Appears normal         Spine:             Not well visualized
(4CH, axis, and
situs)
RVOT:             Appears normal         Lower              Appears normal
Extremities:
LVOT:             Appears normal         Upper              Appears normal
Extremities:

Other:  Heels and 5th digit visualized. Fetus appears to be a female.
Technically difficult due to fetal position.
Cervix Uterus Adnexa

Cervical Length:    3.7       cm

Cervix:       Normal appearance by transabdominal scan.
Left Ovary:    Within normal limits.
Right Ovary:   Within normal limits.

Adnexa:     No abnormality visualized.
Impression

SIUP at 19+2 weeks
Normal detailed fetal anatomy; limited views of profile, spine
and AA
Markers of aneuploidy: none
Normal amniotic fluid volume
Measurements consistent with LMP dating
Recommendations

Follow-up ultrasound in 4-6 weeks to complete anatomy
survey

## 2017-06-28 IMAGING — US US MFM FETAL BPP W/O NON-STRESS
1 series · 14 of 14 positions shown · non-contrast
Comparison: none

[Series 1: us mfm fetal bpp w/o non-stress · 14 acquisitions, 14 frames shown]
[im 1/14]
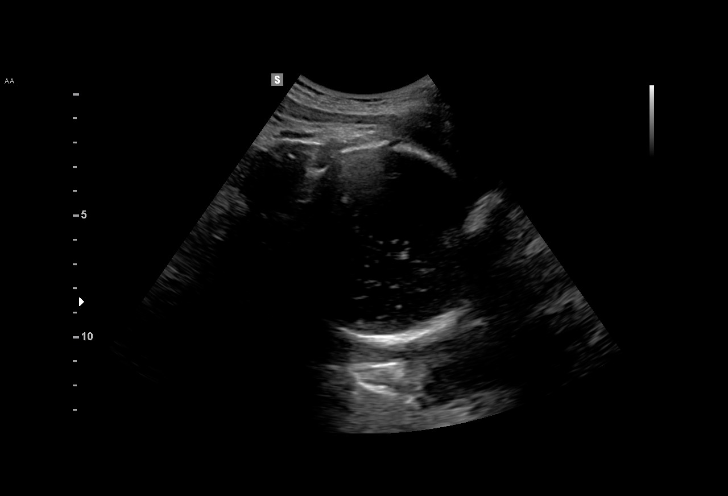
[im 2/14]
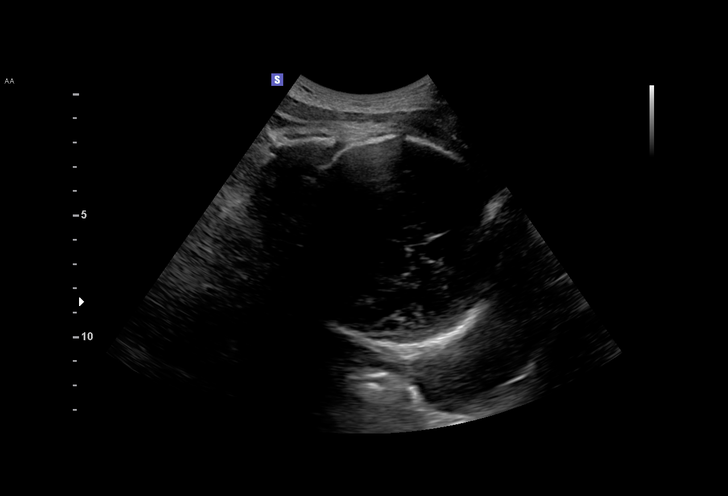
[im 3/14]
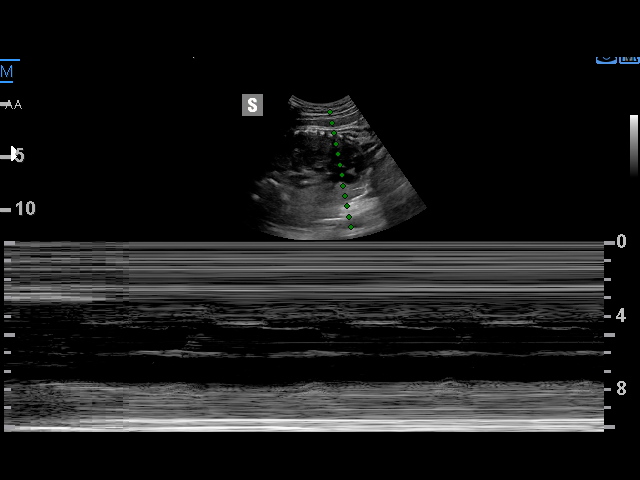
[im 4/14]
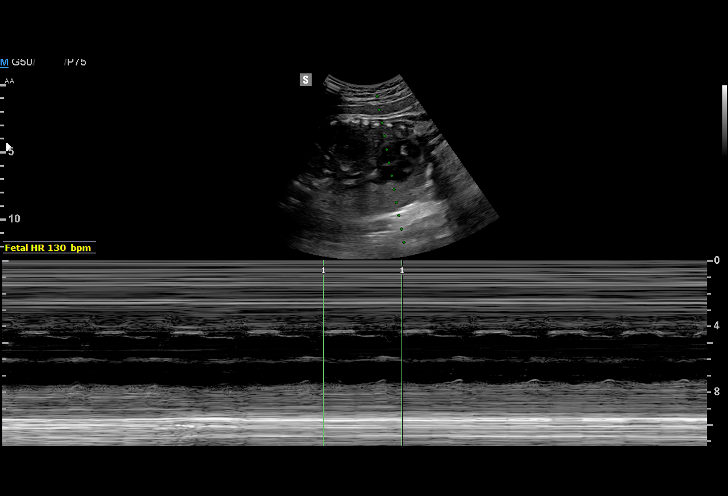
[im 5/14]
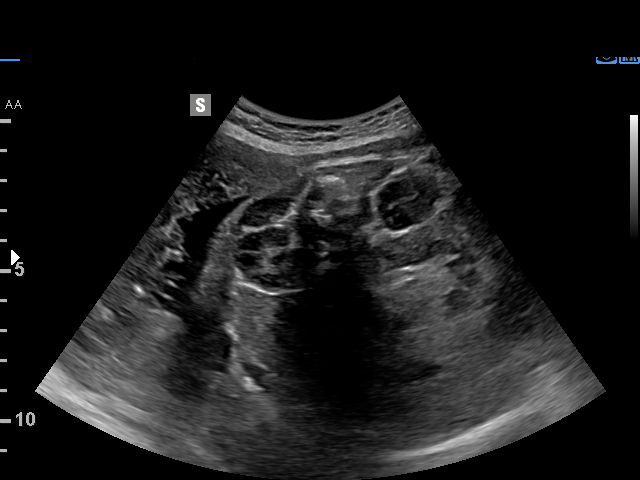
[im 6/14]
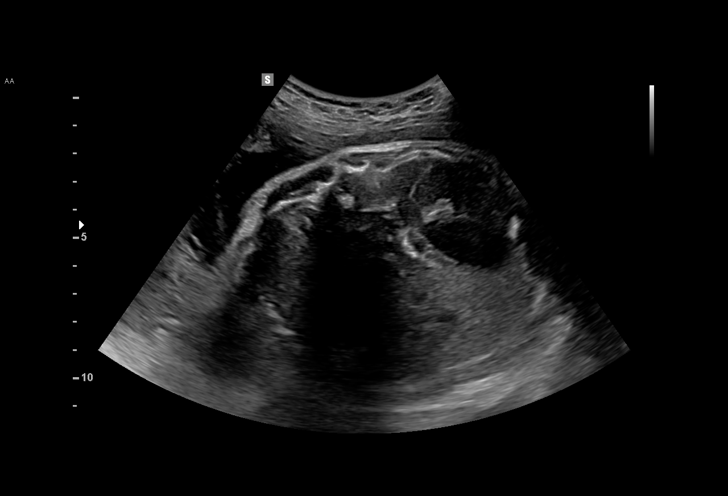
[im 7/14]
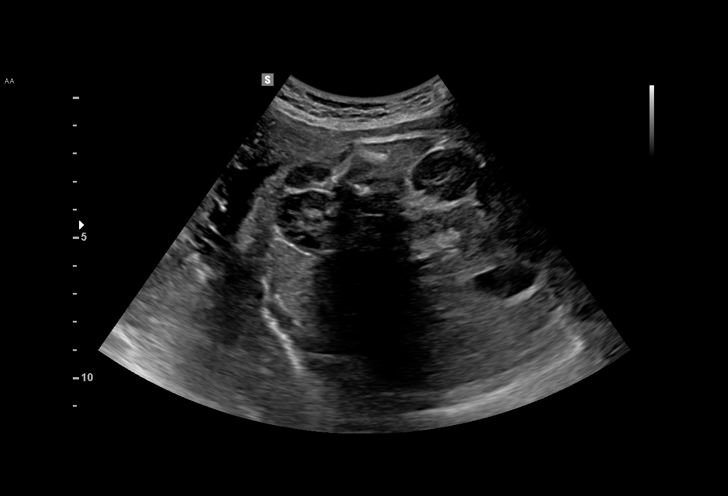
[im 8/14]
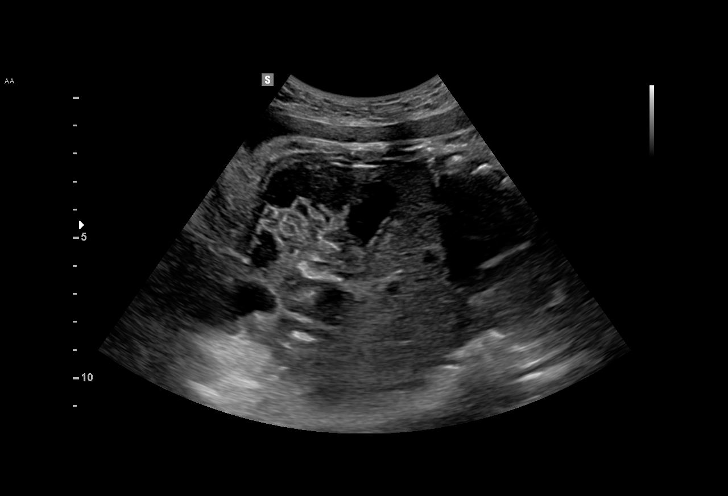
[im 9/14]
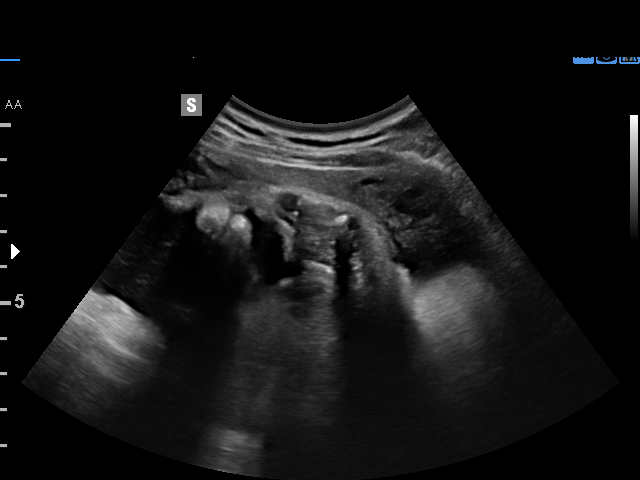
[im 10/14]
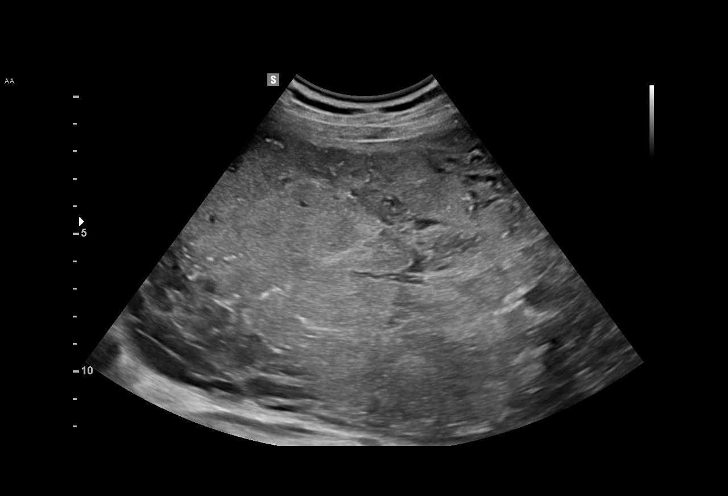
[im 11/14]
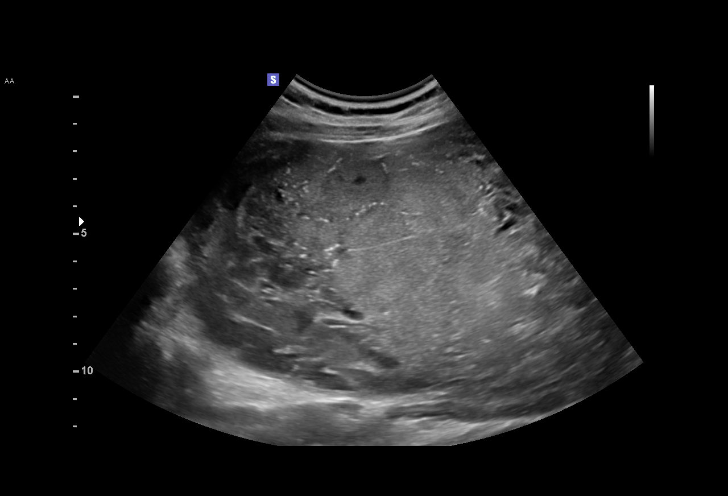
[im 12/14]
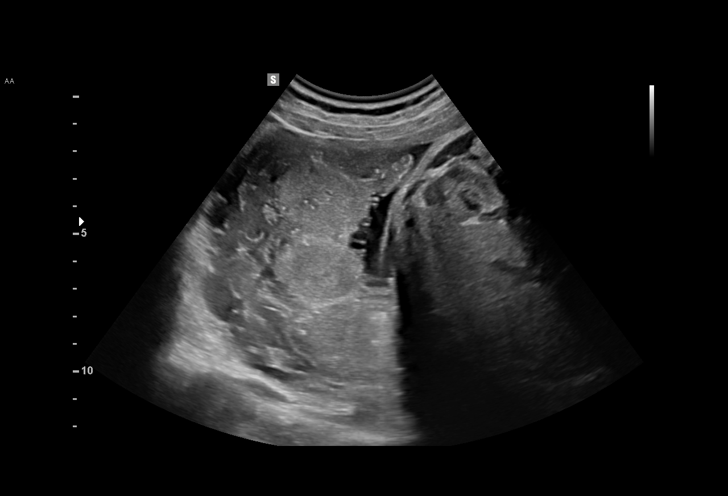
[im 13/14]
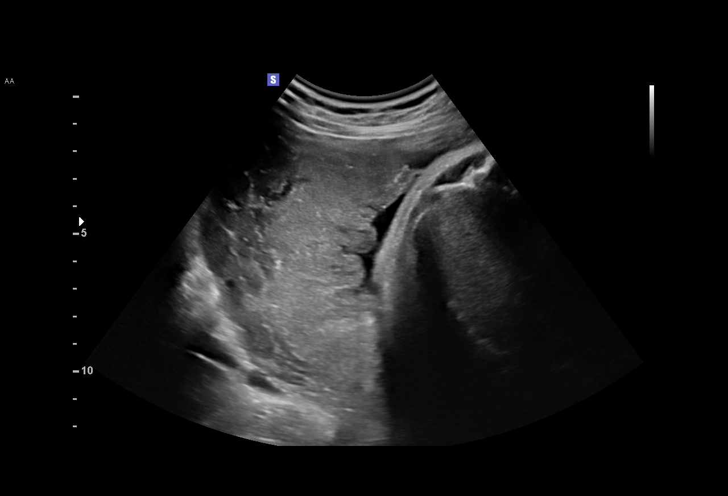
[im 14/14]
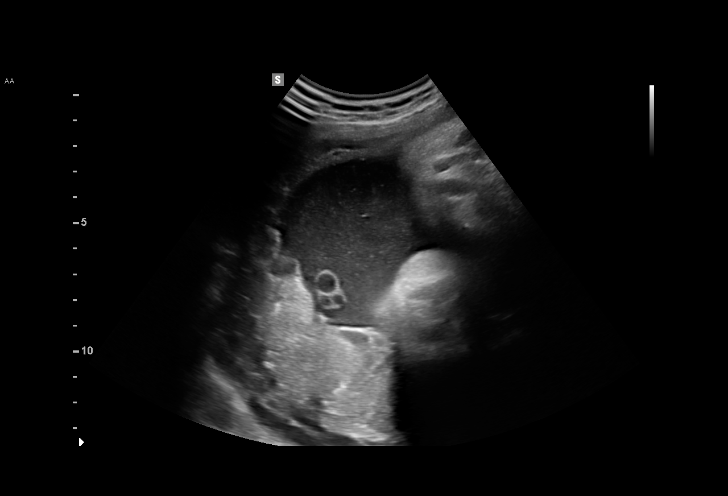

[14 of 14 positions shown; findings below may reference images not displayed]

ROB

1  WASHIGNTON HILAIRE            [id number]      [id number]     [id number]
Indications

Postdate pregnancy (40-42 weeks)
40 weeks gestation of pregnancy
OB History

Gravidity:    2         Term:   1
Living:       1
Fetal Evaluation

Num Of Fetuses:     1
Fetal Heart         130
Rate(bpm):
Cardiac Activity:   Observed
Presentation:       Cephalic
Placenta:           Anterior, above cervical os

Amniotic Fluid
AFI FV:      Subjectively within normal limits
AFI Sum:     12.52   cm       53  %Tile     Larg Pckt:    4.95  cm
RUQ:   4.95    cm   RLQ:    2.95   cm    LUQ:   0.76    cm   LLQ:    3.86   cm
Biophysical Evaluation

Amniotic F.V:   Within normal limits       F. Tone:        Observed
F. Movement:    Observed                   Score:          [DATE]
F. Breathing:   Observed
Gestational Age
LMP:           40w 3d        Date:  07/01/14                 EDD:   04/07/15
Best:          40w 3d     Det. By:  LMP  (07/01/14)          EDD:   04/07/15
Impression

SIUP at 51w0d
active singleton fetus
no previa
BPP [DATE]
Recommendations

Continue antenatal testing until delivery.  Management as
otherwise clinically indicated (interpretation of ultrasound
only).

## 2023-01-10 ENCOUNTER — Ambulatory Visit
Admission: RE | Admit: 2023-01-10 | Discharge: 2023-01-10 | Disposition: A | Discharge status: Home / Self Care | Payer: Self-pay | Source: Ambulatory Visit

## 2023-01-10 VITALS — BP 120/77 | HR 98 | Temp 98.4°F | Resp 16

## 2023-01-10 DIAGNOSIS — M269 Dentofacial anomaly, unspecified: Secondary | ICD-10-CM

## 2023-01-10 NOTE — ED Provider Notes (Signed)
[facility]    CSN: [id number] Arrival date & time: 01/10/23  1124      History   Chief Complaint Chief Complaint  Patient presents with   Jaw Problem    HPI Michelle Heath is a 32 y.o. female.  Reports hearing a pop in her left jaw yesterday, and now it feels "loose". She is not having any pain but she is concerned to eat and chew. 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  Past Medical History:  Diagnosis Date   Anemia    No pertinent past medical history     Patient Active Problem List   Diagnosis Date Noted   Labor and delivery indication for care or intervention 04/14/2015   NSVD (normal spontaneous vaginal delivery) 04/14/2015    Past Surgical History:  Procedure Laterality Date   FACIAL RECONSTRUCTION SURGERY      OB History     Gravida  3   Para  2   Term  2   Preterm  0   AB  1   Living  2      SAB  0   IAB  1   Ectopic  0   Multiple  0   Live Births  2            Home Medications    Prior to Admission medications   Medication Sig Start Date End Date Taking? Authorizing Provider  ferrous sulfate 325 (65 FE) MG tablet Take 325 mg by mouth 2 (two) times daily with a meal.    [provider]  ibuprofen (ADVIL,MOTRIN) 600 MG tablet Take 1 tablet (600 mg total) by mouth every 6 (six) hours. 04/15/15   Beaulah Dinning, MD    Family History Family History  Problem Relation Age of Onset   Other Neg Hx    Diabetes Paternal Grandfather     Social History Social History   Tobacco Use   Smoking status: Never   Smokeless tobacco: Never  Substance Use Topics   Alcohol use: No    Comment: twice per month   Drug use: No     Allergies   Latex   Review of Systems Review of Systems Per HPI  Physical Exam Triage Vital Signs ED Triage Vitals  Encounter Vitals Group     BP 01/10/23 1142 120/77     Systolic BP Percentile --      Diastolic  BP Percentile --      Pulse Rate 01/10/23 1142 98     Resp 01/10/23 1142 16     Temp 01/10/23 1142 98.4 F (36.9 C)     Temp Source 01/10/23 1142 Oral     SpO2 01/10/23 1142 96 %     Weight --      Height --      Head Circumference --      Peak Flow --      Pain Score 01/10/23 1138 0     Pain Loc --      Pain Education --      Exclude from Growth Chart --    No data found.  Updated Vital Signs BP 120/77 (BP Location: Left Arm)   Pulse 98   Temp 98.4 F (36.9 C) (Oral)   Resp 16   LMP 12/31/2022 (Exact Date)   SpO2 96%     Physical Exam Vitals and nursing note reviewed.  Constitutional:  General: She is not in acute distress.    Appearance: Normal appearance.  HENT:     Head: No contusion.     Jaw: There is normal jaw occlusion. No tenderness, swelling or pain on movement.  Cardiovascular:     Rate and Rhythm: Normal rate and regular rhythm.     Heart sounds: Normal heart sounds.  Pulmonary:     Effort: Pulmonary effort is normal.     Breath sounds: Normal breath sounds.  Neurological:     Mental Status: She is alert.     UC Treatments / Results  Labs (all labs ordered are listed, but only abnormal results are displayed) Labs Reviewed - No data to display  EKG   Radiology No results found.  Procedures Procedures (including critical care time)  Medications Ordered in UC Medications - No data to display  Initial Impression / Assessment and Plan / UC Course  I have reviewed the triage vital signs and the nursing notes.  Pertinent labs & imaging results that were available during my care of the patient were reviewed by me and considered in my medical decision making (see chart for details).  No abnormality on exam. Normal occlusion. No pain. No dental tenderness. Discussed limitations of urgent care with patient I recommend she contact an oral surgeon for follow up. We have set her up with a PCP today Can go to ED with any pain or new  symptoms Patient verbalizes understanding   Final Clinical Impressions(s) / UC Diagnoses   Final diagnoses:  Anomaly of jaw     Discharge Instructions      If you develop pain or discomfort please go to the emergency department  Otherwise please call the oral surgery clinics to see if an appointment can be made     ED Prescriptions   None    PDMP not reviewed this encounter.   Marlow Baars, [place] 01/10/23 1346

## 2023-01-10 NOTE — ED Triage Notes (Signed)
Pt presnts to UC for c/o loose jaw since yesterday. Pt reports that she felt the left side of her jaw "pop" and it felt loose when she was brushing her teeth. 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 She denies pain but is apprehensive to try to bite down.

## 2023-01-10 NOTE — Discharge Instructions (Signed)
If you develop pain or discomfort please go to the emergency department  Otherwise please call the oral surgery clinics to see if an appointment can be made

## 2023-01-11 ENCOUNTER — Emergency Department (HOSPITAL_COMMUNITY)
Admission: EM | Admit: 2023-01-11 | Discharge: 2023-01-11 | Disposition: A | Discharge status: Home / Self Care | Payer: Self-pay | Attending: Emergency Medicine | Admitting: Emergency Medicine

## 2023-01-11 ENCOUNTER — Emergency Department (HOSPITAL_COMMUNITY): Payer: Self-pay

## 2023-01-11 ENCOUNTER — Other Ambulatory Visit: Payer: Self-pay

## 2023-01-11 ENCOUNTER — Encounter (HOSPITAL_COMMUNITY): Payer: Self-pay

## 2023-01-11 DIAGNOSIS — Z91040 Latex allergy status: Secondary | ICD-10-CM | POA: Insufficient documentation

## 2023-01-11 DIAGNOSIS — R6884 Jaw pain: Secondary | ICD-10-CM | POA: Insufficient documentation

## 2023-01-11 DIAGNOSIS — R220 Localized swelling, mass and lump, head: Secondary | ICD-10-CM | POA: Insufficient documentation

## 2023-01-11 NOTE — ED Triage Notes (Signed)
Pt came to ED for jaw pain, seen yesterday for same. Jaw pain started Monday night. Pt states unable to eat. Axox4.

## 2023-01-11 NOTE — Discharge Instructions (Signed)
Your history, exam and workup today is consistent with loose hardware in your jaw.  The CT scan did not show acute hardware problem but I suspect it is loose.  I spoke to Dr. Jenne Pane with ENT who recommended close follow-up.  Please call tomorrow to follow-up and rest and stay hydrated.  If any symptoms change or worsen acutely, please turn to the nearest Emergency Department.

## 2023-01-11 NOTE — ED Provider Notes (Signed)
[facility] Provider Note   CSN: [id number] Arrival date & time: 01/11/23  1607     History {Add pertinent medical, surgical, social history, OB history to HPI:1} Chief Complaint  Patient presents with   Jaw Pain    Michelle Heath is a 32 y.o. female.  The history is provided by the patient. No language interpreter was used.  Mouth Injury This is a new problem. The current episode started more than 2 days ago. The problem occurs constantly. The problem has not changed since onset.Pertinent negatives include no chest pain, no abdominal pain, no headaches and no shortness of breath. Nothing aggravates the symptoms. Nothing relieves the symptoms. She has tried nothing for the symptoms. The treatment provided no relief.       Home Medications Prior to Admission medications   Medication Sig Start Date End Date Taking? Authorizing Provider  ferrous sulfate 325 (65 FE) MG tablet Take 325 mg by mouth 2 (two) times daily with a meal.    [provider]  ibuprofen (ADVIL,MOTRIN) 600 MG tablet Take 1 tablet (600 mg total) by mouth every 6 (six) hours. 04/15/15   Beaulah Dinning, MD      Allergies    Latex    Review of Systems   Review of Systems  Constitutional:  Negative for chills, fatigue and fever.  HENT:  Positive for facial swelling. Negative for congestion.   Respiratory:  Negative for cough, chest tightness, shortness of breath and wheezing.   Cardiovascular:  Negative for chest pain.  Gastrointestinal:  Negative for abdominal pain, constipation, diarrhea, nausea and vomiting.  Genitourinary:  Negative for dysuria.  Musculoskeletal:  Negative for back pain, neck pain and neck stiffness.  Skin:  Negative for wound.  Neurological:  Negative for headaches.  All other systems reviewed and are negative.   Physical Exam Updated Vital Signs BP (!) 141/85 (BP Location: Right Arm)   Pulse 97   Temp 97.9 F  (36.6 C)   Resp 16   Ht 5\' 1"  (1.549 m)   Wt 68 kg   LMP 12/31/2022 (Exact Date)   SpO2 99%   BMI 28.34 kg/m  Physical Exam Vitals and nursing note reviewed.  Constitutional:      General: She is not in acute distress.    Appearance: She is well-developed. She is not ill-appearing, toxic-appearing or diaphoretic.  HENT:     Head:     Jaw: Trismus, tenderness, swelling and pain on movement present.     Comments: Patient has palpably loose hardware in the left jaw.  She has trismus.  No erythema seen but she does have some swelling in the left submandibular area and left jaw area.  No stridor.  Exam otherwise unremarkable.  Neck and back of the head nontender.    Nose: No congestion or rhinorrhea.     Mouth/Throat:     Pharynx: No oropharyngeal exudate or posterior oropharyngeal erythema.  Eyes:     Extraocular Movements: Extraocular movements intact.     Conjunctiva/sclera: Conjunctivae normal.     Pupils: Pupils are equal, round, and reactive to light.  Cardiovascular:     Rate and Rhythm: Normal rate and regular rhythm.     Heart sounds: No murmur heard. Pulmonary:     Effort: Pulmonary effort is normal. No respiratory distress.     Breath sounds: Normal breath sounds. No wheezing, rhonchi or rales.  Chest:     Chest wall: No tenderness.  Abdominal:     General: Abdomen is flat.     Palpations: Abdomen is soft.     Tenderness: There is no abdominal tenderness.  Musculoskeletal:        General: Tenderness present. No swelling.     Cervical back: Neck supple. No tenderness.  Skin:    General: Skin is warm and dry.     Capillary Refill: Capillary refill takes less than 2 seconds.     Findings: No erythema or rash.  Neurological:     General: No focal deficit present.     Mental Status: She is alert.  Psychiatric:        Mood and Affect: Mood normal.     ED Results / Procedures / Treatments   Labs (all labs ordered are listed, but only abnormal results are  displayed) Labs Reviewed - No data to display  EKG None  Radiology No results found.  Procedures Procedures  {Document cardiac monitor, telemetry assessment procedure when appropriate:1}  Medications Ordered in ED Medications - No data to display  ED Course/ Medical Decision Making/ A&P   {   Click here for ABCD2, HEART and other calculatorsREFRESH Note before signing :1}                              Medical Decision Making Amount and/or Complexity of Data Reviewed Radiology: ordered.    Michelle Heath is a 32 y.o. 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  According to patient, last week she fell hit the back of her head on the ground with a mechanical fall.  She had minimal headache and no other problems but Monday night, while getting ready to eat she felt a pop and has had loose hardware in her left jaw since then.  She reports that she has trismus and cannot open her jaw all the way.  It hurts when she tries to chew.  She is just been drinking fluids since.  She reports she can wiggle the metal piece in her left jaw and that is new that is never happened before.  She denies any other headache now and denies any neck pain, chest pain, abdominal pain, or back pain.  Denies any numbness, tingling, weakness of extremities.  Denies any vision changes and denies any fevers, chills, congestion, cough, nausea, vomiting, or other complaints.  She reports the pain is moderate but she does not want medicine right now.  She reports that she went to urgent care who told her she needed to come to the emergency department and she reports she tried to contact dentistry and oral surgery and they reported they needed referrals.  On my exam, patient does indeed have what feels to be loose hardware in her left  jaw on palpation.  She would not open her jaw more than several fingerbreadths but I did not see evidence of PTA or RPA on exam.  No stridor.  Lungs clear.  Chest and back nontender.  Neck nontender.  Head nontender.  Pupil symmetric and reactive with normal extract movements.  Patient otherwise resting comfortably.  Will get CT of the head and face to look for fracture or hardware malfunction and given the fall to the back of the head last week will look for  other intracranial injury.  Anticipate reassessment after workup to determine disposition.       10:02 PM CT scan returned without evidence of clear hardware malfunction.  Given the loose hardware on exam I called ENT who would like to see her in clinic as soon as possible.  He did not feel she is admission or surgery right now.  Patient agrees with this plan and will be given information to follow-up with ENT with Dr. Jenne Pane.  Patient agreed with plan of care and was discharged in good condition.    {Document critical care time when appropriate:1} {Document review of labs and clinical decision tools ie heart score, Chads2Vasc2 etc:1}  {Document your independent review of radiology images, and any outside records:1} {Document your discussion with family members, caretakers, and with consultants:1} {Document social determinants of health affecting pt's care:1} {Document your decision making why or why not admission, treatments were needed:1} Final Clinical Impression(s) / ED Diagnoses Final diagnoses:  None    Rx / DC Orders ED Discharge Orders     None

## 2023-01-31 ENCOUNTER — Ambulatory Visit: Payer: Self-pay | Admitting: Internal Medicine

## 2023-02-22 ENCOUNTER — Ambulatory Visit: Payer: Self-pay | Admitting: Internal Medicine

## 2023-07-07 ENCOUNTER — Ambulatory Visit
Admission: EM | Admit: 2023-07-07 | Discharge: 2023-07-07 | Disposition: A | Discharge status: Home / Self Care | Payer: Self-pay | Attending: Nurse Practitioner | Admitting: Nurse Practitioner

## 2023-07-07 DIAGNOSIS — L255 Unspecified contact dermatitis due to plants, except food: Secondary | ICD-10-CM

## 2023-07-07 DIAGNOSIS — R220 Localized swelling, mass and lump, head: Secondary | ICD-10-CM

## 2023-07-07 DIAGNOSIS — L299 Pruritus, unspecified: Secondary | ICD-10-CM

## 2023-07-07 MED ORDER — PREDNISONE 10 MG (21) PO TBPK: ORAL_TABLET | Freq: Every day | ORAL | 0 refills | Status: AC

## 2023-07-07 MED ORDER — DEXAMETHASONE SODIUM PHOSPHATE 10 MG/ML IJ SOLN
10.0000 mg | Freq: Once | INTRAMUSCULAR | Status: AC
  Administered 2023-07-07: 10 mg via INTRAMUSCULAR

## 2023-07-07 NOTE — ED Triage Notes (Signed)
 Patient presents to the office for allergic reaction to poison ivy x 3 days.

## 2023-07-07 NOTE — ED Provider Notes (Signed)
 [facility]    CSN: [id number] Arrival date & time: 07/07/23  4098      History   Chief Complaint No chief complaint on file.   HPI Michelle Heath is a 33 y.o. female.   Michelle Heath is a 33 y.o. female that presents with right eye swelling and a widespread rash, suspected to be due to poison ivy exposure. The patient was exposed to poison ivy on Tuesday while cleaning an outdoor space for her daughters to play. The patient developed a rash immediately on their arms, fingers, and face following the exposure. Facial swelling began on Wednesday, progressively worsening over the next few days. Upon waking up this morning, patient noted that the swelling had become severe, particularly affecting the right eye. The patient reports itching but tries to avoid scratching. Both eyes are watery, and the patient is having difficulty opening her right eye, though vision is not impaired when the eyes are open. The patient denies any sore throat or problems swallowing. The patient has attempted to manage symptoms with over-the-counter antihistamines, including Benadryl , loratadine, and Allegra, but reports that these medications have been ineffective, with symptoms continuing to worsen.  The following portions of the patient's history were reviewed and updated as appropriate: allergies, current medications, past family history, past medical history, past social history, past surgical history, and problem list.    Past Medical History:  Diagnosis Date   Anemia    No pertinent past medical history     Patient Active Problem List   Diagnosis Date Noted   Labor and delivery indication for care or intervention 04/14/2015   NSVD (normal spontaneous vaginal delivery) 04/14/2015    Past Surgical History:  Procedure Laterality Date   FACIAL RECONSTRUCTION SURGERY      OB History     Gravida  3   Para  2   Term  2   Preterm  0   AB  1   Living  2      SAB   0   IAB  1   Ectopic  0   Multiple  0   Live Births  2            Home Medications    Prior to Admission medications   Medication Sig Start Date End Date Taking? Authorizing Provider  predniSONE (STERAPRED UNI-PAK 21 TAB) 10 MG (21) TBPK tablet Take by mouth daily. Take 6 tabs by mouth daily  for 2 days, then 5 tabs for 2 days, then 4 tabs for 2 days, then 3 tabs for 2 days, 2 tabs for 2 days, then 1 tab by mouth daily for 2 days 07/07/23  Yes Maryruth Sol, FNP    Family History Family History  Problem Relation Age of Onset   Other Neg Hx    Diabetes Paternal Grandfather     Social History Social History   Tobacco Use   Smoking status: Never   Smokeless tobacco: Never  Substance Use Topics   Alcohol use: No    Comment: twice per month   Drug use: No     Allergies   Latex   Review of Systems Review of Systems  HENT:  Positive for facial swelling. Negative for sore throat and trouble swallowing.   Eyes:  Positive for discharge (watery).  Respiratory:  Negative for shortness of breath.   Skin:  Positive for rash (itchy).  All other systems reviewed and are negative.    Physical Exam Triage  Vital Signs ED Triage Vitals [07/07/23 0846]  Encounter Vitals Group     BP (!) 150/84     Girls Systolic BP Percentile      Girls Diastolic BP Percentile      Boys Systolic BP Percentile      Boys Diastolic BP Percentile      Pulse Rate 83     Resp 20     Temp 98.2 F (36.8 C)     Temp Source Oral     SpO2 98 %     Weight      Height      Head Circumference      Peak Flow      Pain Score      Pain Loc      Pain Education      Exclude from Growth Chart    No data found.  Updated Vital Signs BP (!) 150/84 (BP Location: Left Arm)   Pulse 83   Temp 98.2 F (36.8 C) (Oral)   Resp 20   LMP 07/01/2023 (Approximate)   SpO2 98%   Visual Acuity Right Eye Distance:   Left Eye Distance:   Bilateral Distance:    Right Eye Near:   Left Eye Near:     Bilateral Near:     Physical Exam Vitals reviewed.  Constitutional:      General: She is not in acute distress.    Appearance: Normal appearance. She is not toxic-appearing.  HENT:     Head: Normocephalic.     Comments: Mild erythema and edema noted throughout the entire face     Mouth/Throat:     Mouth: Mucous membranes are moist.     Pharynx: Oropharynx is clear. Uvula midline. No pharyngeal swelling, posterior oropharyngeal erythema or uvula swelling.   Eyes:     General: Vision grossly intact.        Right eye: No discharge.        Left eye: No discharge.     Conjunctiva/sclera: Conjunctivae normal.     Comments: Right periorbital and eyelid edema noted.   Neck:     Trachea: Phonation normal.   Cardiovascular:     Rate and Rhythm: Normal rate and regular rhythm.     Heart sounds: Normal heart sounds.  Pulmonary:     Effort: Pulmonary effort is normal.     Breath sounds: Normal breath sounds and air entry.   Musculoskeletal:        General: Normal range of motion.     Cervical back: Full passive range of motion without pain, normal range of motion and neck supple.  Lymphadenopathy:     Cervical: No cervical adenopathy.   Skin:    General: Skin is warm and dry.     Findings: Rash present.     Comments: Scant, diffused, erythematous rash noted to the bilateral arms    Neurological:     General: No focal deficit present.     Mental Status: She is alert and oriented to person, place, and time.      UC Treatments / Results  Labs (all labs ordered are listed, but only abnormal results are displayed) Labs Reviewed - No data to display  EKG   Radiology No results found.  Procedures Procedures (including critical care time)  Medications Ordered in UC Medications  dexamethasone (DECADRON) injection 10 mg (10 mg Intramuscular Given 07/07/23 0916)    Initial Impression / Assessment and Plan / UC Course  I have reviewed  the triage vital signs and the  nursing notes.  Pertinent labs & imaging results that were available during my care of the patient were reviewed by me and considered in my medical decision making (see chart for details).     Patient presents with severe right eye and facial swelling along with bilateral eye watering and a spreading rash consistent with poison ivy exposure earlier this week. The rash began on the arms, fingers, and face shortly after exposure on Tuesday, with progressive facial swelling developing by Wednesday and worsening significantly by this morning. Vision remains unaffected when the eyes are open, and there are no signs of systemic involvement such as sore throat or difficulty swallowing. The patient has tried multiple antihistamines without relief. A dexamethasone injection was administered in clinic and a prednisone taper was prescribed. Patient was advised to take Benadryl  every 4 hours for the next 24 hours, continue either Allegra or loratadine (but not both), and use only fragrance-free products to avoid further irritation. Patient was instructed to follow up or seek emergency care if redness develops in the white part of the eye, there is pain with eye movement, signs of periorbital cellulitis appear, or if any vision changes, fever, severe headache, or double vision occur.  Today's evaluation has revealed no signs of a dangerous process. Discussed diagnosis with patient and/or guardian. Patient and/or guardian aware of their diagnosis, possible red flag symptoms to watch out for and need for close follow up. Patient and/or guardian understands verbal and written discharge instructions. Patient and/or guardian comfortable with plan and disposition.  Patient and/or guardian has a clear mental status at this time, good insight into illness (after discussion and teaching) and has clear judgment to make decisions regarding their care  Documentation was completed with the aid of voice recognition software.  Transcription may contain typographical errors. Final Clinical Impressions(s) / UC Diagnoses   Final diagnoses:  Toxicodendron dermatitis  Pruritus  Facial swelling     Discharge Instructions      You are being treated for an allergic reaction likely caused by poison ivy exposure, resulting in facial and eye swelling, rash, and irritation. A steroid injection was given today, and you were prescribed a steroid taper to help reduce inflammation. Take the steroids exactly as directed until the taper is complete. You should take Benadryl  every 4 hours for the next 24 hours to help with itching and swelling, then as needed. Continue taking either Allegra or loratadine daily, but not both. Avoid using scented or fragranced products on your skin to prevent further irritation. Apply cool compresses to affected areas and keep the skin clean and dry.  Seek medical attention if the white part of your eye becomes red, if you feel pain when blinking or moving the eye, or if the swelling spreads to the eyelids, eyebrows, or cheek.   Go to the emergency department if you experience vision changes, double vision, eye pain, fever, or a severe headache.      ED Prescriptions     Medication Sig Dispense Auth. Provider   predniSONE (STERAPRED UNI-PAK 21 TAB) 10 MG (21) TBPK tablet Take by mouth daily. Take 6 tabs by mouth daily  for 2 days, then 5 tabs for 2 days, then 4 tabs for 2 days, then 3 tabs for 2 days, 2 tabs for 2 days, then 1 tab by mouth daily for 2 days 42 tablet Maryruth Sol, FNP      PDMP not reviewed this encounter.   Belinda Bringhurst,  Nambe, FNP 07/07/23 [phone]

## 2023-07-07 NOTE — Discharge Instructions (Addendum)
 You are being treated for an allergic reaction likely caused by poison ivy exposure, resulting in facial and eye swelling, rash, and irritation. A steroid injection was given today, and you were prescribed a steroid taper to help reduce inflammation. Take the steroids exactly as directed until the taper is complete. You should take Benadryl  every 4 hours for the next 24 hours to help with itching and swelling, then as needed. Continue taking either Allegra or loratadine daily, but not both. Avoid using scented or fragranced products on your skin to prevent further irritation. Apply cool compresses to affected areas and keep the skin clean and dry.  Seek medical attention if the white part of your eye becomes red, if you feel pain when blinking or moving the eye, or if the swelling spreads to the eyelids, eyebrows, or cheek.   Go to the emergency department if you experience vision changes, double vision, eye pain, fever, or a severe headache.
